# Patient Record
Sex: Female | Born: 1955 | Race: White | Hispanic: No | Marital: Married | Smoking: Never smoker
Health system: Southern US, Community
[De-identification: ages and names within clinical notes are randomized; demographics above are authoritative.]

## PROBLEM LIST (undated history)

## (undated) DIAGNOSIS — I10 Essential (primary) hypertension: Secondary | ICD-10-CM

## (undated) HISTORY — PX: TONSILLECTOMY: SUR1361

## (undated) HISTORY — DX: Essential (primary) hypertension: I10

---

## 1998-04-03 ENCOUNTER — Other Ambulatory Visit: Admission: RE | Admit: 1998-04-03 | Discharge: 1998-04-03 | Payer: Self-pay | Admitting: Obstetrics and Gynecology

## 1999-05-09 ENCOUNTER — Other Ambulatory Visit: Admission: RE | Admit: 1999-05-09 | Discharge: 1999-05-09 | Payer: Self-pay | Admitting: *Deleted

## 1999-05-21 ENCOUNTER — Ambulatory Visit (HOSPITAL_COMMUNITY): Admission: RE | Admit: 1999-05-21 | Discharge: 1999-05-21 | Payer: Self-pay | Admitting: Obstetrics and Gynecology

## 1999-05-21 ENCOUNTER — Encounter: Payer: Self-pay | Admitting: Obstetrics and Gynecology

## 2000-08-13 ENCOUNTER — Other Ambulatory Visit: Admission: RE | Admit: 2000-08-13 | Discharge: 2000-08-13 | Payer: Self-pay | Admitting: *Deleted

## 2000-08-13 ENCOUNTER — Other Ambulatory Visit: Admission: RE | Admit: 2000-08-13 | Discharge: 2000-08-13 | Payer: Self-pay | Admitting: Obstetrics and Gynecology

## 2000-08-14 ENCOUNTER — Other Ambulatory Visit: Admission: RE | Admit: 2000-08-14 | Discharge: 2000-08-14 | Payer: Self-pay | Admitting: *Deleted

## 2000-08-14 ENCOUNTER — Ambulatory Visit (HOSPITAL_COMMUNITY): Admission: RE | Admit: 2000-08-14 | Discharge: 2000-08-14 | Payer: Self-pay | Admitting: Obstetrics and Gynecology

## 2000-08-14 ENCOUNTER — Encounter (INDEPENDENT_AMBULATORY_CARE_PROVIDER_SITE_OTHER): Payer: Self-pay

## 2000-08-14 ENCOUNTER — Encounter: Payer: Self-pay | Admitting: Obstetrics and Gynecology

## 2000-11-16 ENCOUNTER — Other Ambulatory Visit: Admission: RE | Admit: 2000-11-16 | Discharge: 2000-11-16 | Payer: Self-pay | Admitting: *Deleted

## 2001-08-17 ENCOUNTER — Ambulatory Visit (HOSPITAL_COMMUNITY): Admission: RE | Admit: 2001-08-17 | Discharge: 2001-08-17 | Payer: Self-pay | Admitting: Obstetrics and Gynecology

## 2001-08-17 ENCOUNTER — Encounter: Payer: Self-pay | Admitting: Obstetrics and Gynecology

## 2001-09-09 ENCOUNTER — Other Ambulatory Visit: Admission: RE | Admit: 2001-09-09 | Discharge: 2001-09-09 | Payer: Self-pay | Admitting: Obstetrics and Gynecology

## 2002-11-08 ENCOUNTER — Ambulatory Visit (HOSPITAL_COMMUNITY): Admission: RE | Admit: 2002-11-08 | Discharge: 2002-11-08 | Payer: Self-pay | Admitting: Obstetrics and Gynecology

## 2002-11-08 ENCOUNTER — Encounter: Payer: Self-pay | Admitting: Obstetrics and Gynecology

## 2002-11-11 ENCOUNTER — Other Ambulatory Visit: Admission: RE | Admit: 2002-11-11 | Discharge: 2002-11-11 | Payer: Self-pay | Admitting: Obstetrics and Gynecology

## 2003-12-04 ENCOUNTER — Ambulatory Visit (HOSPITAL_COMMUNITY): Admission: RE | Admit: 2003-12-04 | Discharge: 2003-12-04 | Payer: Self-pay | Admitting: Obstetrics and Gynecology

## 2004-01-08 ENCOUNTER — Other Ambulatory Visit: Admission: RE | Admit: 2004-01-08 | Discharge: 2004-01-08 | Payer: Self-pay | Admitting: Obstetrics and Gynecology

## 2004-01-22 ENCOUNTER — Encounter: Admission: RE | Admit: 2004-01-22 | Discharge: 2004-01-22 | Payer: Self-pay | Admitting: Obstetrics and Gynecology

## 2004-12-03 ENCOUNTER — Ambulatory Visit (HOSPITAL_COMMUNITY): Admission: RE | Admit: 2004-12-03 | Discharge: 2004-12-03 | Payer: Self-pay | Admitting: Obstetrics and Gynecology

## 2005-04-08 ENCOUNTER — Other Ambulatory Visit: Admission: RE | Admit: 2005-04-08 | Discharge: 2005-04-08 | Payer: Self-pay | Admitting: Obstetrics & Gynecology

## 2005-12-24 ENCOUNTER — Encounter: Admission: RE | Admit: 2005-12-24 | Discharge: 2005-12-24 | Payer: Self-pay | Admitting: Obstetrics and Gynecology

## 2007-01-18 ENCOUNTER — Ambulatory Visit (HOSPITAL_COMMUNITY): Admission: RE | Admit: 2007-01-18 | Discharge: 2007-01-18 | Payer: Self-pay | Admitting: Internal Medicine

## 2007-05-28 ENCOUNTER — Ambulatory Visit (HOSPITAL_COMMUNITY): Admission: RE | Admit: 2007-05-28 | Discharge: 2007-05-28 | Payer: Self-pay | Admitting: Orthopedic Surgery

## 2007-09-09 ENCOUNTER — Ambulatory Visit: Payer: Self-pay | Admitting: Gastroenterology

## 2007-09-21 ENCOUNTER — Ambulatory Visit: Payer: Self-pay | Admitting: Gastroenterology

## 2007-09-21 HISTORY — PX: COLONOSCOPY: SHX174

## 2008-05-16 ENCOUNTER — Ambulatory Visit (HOSPITAL_COMMUNITY): Admission: RE | Admit: 2008-05-16 | Discharge: 2008-05-16 | Payer: Self-pay | Admitting: Internal Medicine

## 2008-08-09 ENCOUNTER — Encounter: Admission: RE | Admit: 2008-08-09 | Discharge: 2008-08-09 | Payer: Self-pay | Admitting: Obstetrics and Gynecology

## 2009-04-19 ENCOUNTER — Encounter: Admission: RE | Admit: 2009-04-19 | Discharge: 2009-04-19 | Payer: Self-pay | Admitting: Internal Medicine

## 2009-10-03 ENCOUNTER — Ambulatory Visit: Payer: Self-pay

## 2009-10-03 ENCOUNTER — Encounter: Payer: Self-pay | Admitting: Cardiovascular Disease

## 2010-03-18 ENCOUNTER — Encounter: Payer: Self-pay | Admitting: Obstetrics and Gynecology

## 2010-08-27 ENCOUNTER — Other Ambulatory Visit (HOSPITAL_COMMUNITY): Payer: Self-pay | Admitting: Internal Medicine

## 2010-08-27 DIAGNOSIS — Z1231 Encounter for screening mammogram for malignant neoplasm of breast: Secondary | ICD-10-CM

## 2010-09-03 ENCOUNTER — Ambulatory Visit (HOSPITAL_COMMUNITY)
Admission: RE | Admit: 2010-09-03 | Discharge: 2010-09-03 | Disposition: A | Discharge status: Home / Self Care | Payer: Commercial Indemnity | Source: Ambulatory Visit | Attending: Internal Medicine | Admitting: Internal Medicine

## 2010-09-03 DIAGNOSIS — Z1231 Encounter for screening mammogram for malignant neoplasm of breast: Secondary | ICD-10-CM

## 2014-03-16 LAB — HM PAP SMEAR: HM PAP: NEGATIVE

## 2015-02-27 LAB — BASIC METABOLIC PANEL
BUN: 16 (ref 4–21)
Creatinine: 0.8 (ref 0.5–1.1)
Glucose: 91
POTASSIUM: 4.4 (ref 3.4–5.3)
SODIUM: 136 — AB (ref 137–147)

## 2015-02-27 LAB — HEPATIC FUNCTION PANEL
ALT: 17 (ref 7–35)
AST: 20 (ref 13–35)
Alkaline Phosphatase: 77 (ref 25–125)
Bilirubin, Total: 0.6

## 2015-02-27 LAB — LIPID PANEL
CHOLESTEROL: 249 — AB (ref 0–200)
HDL: 84 — AB (ref 35–70)
LDL CALC: 149
Triglycerides: 79 (ref 40–160)

## 2015-03-09 LAB — CBC AND DIFFERENTIAL
HEMATOCRIT: 41 (ref 36–46)
Hemoglobin: 13.8 (ref 12.0–16.0)
PLATELETS: 256 (ref 150–399)
WBC: 6.9

## 2015-03-09 LAB — PROGESTERONE: Progesterone: 6.5

## 2015-03-09 LAB — T4, FREE: Free T4: 0.87

## 2015-03-09 LAB — HEMOGLOBIN A1C: HEMOGLOBIN A1C: 5.4

## 2015-03-09 LAB — VITAMIN D 25 HYDROXY (VIT D DEFICIENCY, FRACTURES): VIT D 25 HYDROXY: 70

## 2015-03-09 LAB — TSH: TSH: 0.33 — AB (ref <=5.90)

## 2015-03-09 LAB — MAGNESIUM: MAGNESIUM: 5.1

## 2015-03-09 LAB — T3, FREE: Free T3: 3.7

## 2015-03-09 LAB — ESTRADIOL: Estradiol: 35.4

## 2015-03-09 LAB — ESTROGENS, TOTAL: Estrogen, Total: 217.5

## 2015-03-09 LAB — VITAMIN B12

## 2015-03-28 ENCOUNTER — Encounter: Payer: Self-pay | Admitting: Gastroenterology

## 2015-05-31 DIAGNOSIS — D1800 Hemangioma unspecified site: Secondary | ICD-10-CM | POA: Diagnosis not present

## 2015-05-31 DIAGNOSIS — L821 Other seborrheic keratosis: Secondary | ICD-10-CM | POA: Diagnosis not present

## 2015-05-31 DIAGNOSIS — D225 Melanocytic nevi of trunk: Secondary | ICD-10-CM | POA: Diagnosis not present

## 2015-07-23 DIAGNOSIS — J029 Acute pharyngitis, unspecified: Secondary | ICD-10-CM | POA: Diagnosis not present

## 2015-12-27 ENCOUNTER — Telehealth: Payer: Self-pay | Admitting: Family Medicine

## 2015-12-27 NOTE — Telephone Encounter (Signed)
Call patient to set up a new patient appointment with Dr. Tamala Julian.  We had got a request on the website. No answer, I LVM to call back.

## 2015-12-27 NOTE — Progress Notes (Signed)
[facility] [address], [place] [address] Phone: [phone] Subjective:    I'm seeing this patient by the request  of:  Dr. Sharol Roussel  CC: low back pain   [id number]  Lindsey Dyer is a 60 y.o. female coming in with complaint of low back pain Patient states that this is been going on for 2 years intermittently but seems to have worsened over the course last month. Patient has tried some different exercises with no significant improvement. States that it does seem to be radiating down the left leg, most the pain seems to be in the buttocks area. Denies any weakness. States that is worse with going from a seated to standing position. Denies any fevers chills any abnormal weight loss. Rates the severity of pain 6 out of 10. It is affecting some daily activities. Patient is concerned cousin any type of sitting including in the car for long amount of time causes worsening symptoms.    No past medical history on file. No past surgical history on file. Social History   Social History  . Marital status: Married    Spouse name: N/A  . Number of children: N/A  . Years of education: N/A   Social History Main Topics  . Smoking status: Never Smoker  . Smokeless tobacco: Never Used  . Alcohol use None  . Drug use: Unknown  . Sexual activity: Not Asked   Other Topics Concern  . None   Social History Narrative  . None   Not on File No family history on file. no rheumatologic disease.    Past medical history, social, surgical and family history all reviewed in electronic medical record.  No pertanent information unless stated regarding to the chief complaint.   Review of Systems:Review of systems updated and as accurate as of 12/28/15  No headache, visual changes, nausea, vomiting, diarrhea, constipation, dizziness, abdominal pain, skin rash, fevers, chills, night sweats, weight loss, swollen lymph nodes, body aches, joint swelling, muscle aches,  chest pain, shortness of breath, mood changes.   Objective  Blood pressure 132/80, pulse 78, height 5\' 5"  (1.651 m), weight 156 lb (70.8 kg), SpO2 97 %. Systems examined below as of 12/28/15   General: No apparent distress alert and oriented x3 mood and affect normal, dressed appropriately.  HEENT: Pupils equal, extraocular movements intact  Respiratory: Patient's speak in full sentences and does not appear short of breath  Cardiovascular: No lower extremity edema, non tender, no erythema  Skin: Warm dry intact with no signs of infection or rash on extremities or on axial skeleton.  Abdomen: Soft nontender  Neuro: Cranial nerves II through XII are intact, neurovascularly intact in all extremities with 2+ DTRs and 2+ pulses.  Lymph: No lymphadenopathy of posterior or anterior cervical chain or axillae bilaterally.  Gait normal with good balance and coordination.  MSK:  Non tender with full range of motion and good stability and symmetric strength and tone of shoulders, elbows, wrist, hip, knee and ankles bilaterally.  Back Exam:  Inspection: Unremarkable  Motion: Flexion 30 deg with crepitus and worsening radiation down the left side, Extension 25 deg, Side Bending to 45 deg bilaterally,  Rotation to 45 deg bilaterally  SLR laying: Negative hamstring's type bilaterally XSLR laying: Negative  Palpable tenderness: Tender to palpation in the paraspinal musculature of the lumbar spine. Mostly over the left side. Severe tenderness over the piriformis on the left sign. FABER: Positive on left Sensory change: Gross sensation  intact to all lumbar and sacral dermatomes.  Reflexes: 2+ at both patellar tendons, 2+ at achilles tendons, Babinski's downgoing.  Strength at foot  Plantar-flexion: 5/5 Dorsi-flexion: 5/5 Eversion: 5/5 Inversion: 5/5  Leg strength  Quad: 5/5 Hamstring: 5/5 Hip flexor: 5/5 Hip abductors: 3/5 but symmetric Gait unremarkable.  Procedure note [id number]; 15 minutes spent for  Therapeutic exercises as stated in above notes.  This included exercises focusing on stretching, strengthening, with significant focus on eccentric aspects. Low back exercises that included:  Pelvic tilt/bracing instruction to focus on control of the pelvic girdle and lower abdominal muscles  Glute strengthening exercises, focusing on proper firing of the glutes without engaging the low back muscles Proper stretching techniques for maximum relief for the hamstrings, hip flexors, low back and some rotation where tolerated Proper technique shown and discussed handout in great detail with ATC.  All questions were discussed and answered.     Impression and Recommendations:     This case required medical decision making of moderate complexity.      Note: This dictation was prepared with Dragon dictation along with smaller phrase technology. Any transcriptional errors that result from this process are unintentional.

## 2015-12-28 ENCOUNTER — Ambulatory Visit (INDEPENDENT_AMBULATORY_CARE_PROVIDER_SITE_OTHER)
Admission: RE | Admit: 2015-12-28 | Discharge: 2015-12-28 | Disposition: A | Discharge status: Home / Self Care | Payer: BLUE CROSS/BLUE SHIELD | Source: Ambulatory Visit | Attending: Family Medicine | Admitting: Family Medicine

## 2015-12-28 ENCOUNTER — Ambulatory Visit (INDEPENDENT_AMBULATORY_CARE_PROVIDER_SITE_OTHER): Payer: BLUE CROSS/BLUE SHIELD | Admitting: Family Medicine

## 2015-12-28 ENCOUNTER — Encounter: Payer: Self-pay | Admitting: Family Medicine

## 2015-12-28 VITALS — BP 132/80 | HR 78 | Ht 65.0 in | Wt 156.0 lb

## 2015-12-28 DIAGNOSIS — M5440 Lumbago with sciatica, unspecified side: Secondary | ICD-10-CM

## 2015-12-28 DIAGNOSIS — G8929 Other chronic pain: Secondary | ICD-10-CM

## 2015-12-28 DIAGNOSIS — M5416 Radiculopathy, lumbar region: Secondary | ICD-10-CM

## 2015-12-28 DIAGNOSIS — S76312A Strain of muscle, fascia and tendon of the posterior muscle group at thigh level, left thigh, initial encounter: Secondary | ICD-10-CM | POA: Insufficient documentation

## 2015-12-28 DIAGNOSIS — M5136 Other intervertebral disc degeneration, lumbar region: Secondary | ICD-10-CM | POA: Diagnosis not present

## 2015-12-28 NOTE — Patient Instructions (Signed)
Good to see you  Xray downstairs.  Ice 20 minutes 2 times daily. Usually after activity and before bed. Exercises 3 times a week.  Physical therapy will be calling  Meloxicam daily for 10 days then as needed.  Gabapentin 200mg   At night See me again in 3-4 weeks.

## 2015-12-28 NOTE — Assessment & Plan Note (Signed)
Does have some symptoms of radiation Mil hip abductor weakness.  Piriformis syndrome as well Crepitus noted. Will get xrays  Started on gabapentin and meloxicam for neurologic, as well as any inflammatory processes occurring. We'll start formal physical. Return to office in 2-3 weeks.

## 2015-12-29 ENCOUNTER — Telehealth: Payer: Self-pay | Admitting: Family Medicine

## 2015-12-29 NOTE — Telephone Encounter (Signed)
Pt called during Saturday clinic. Medications- Meloxicam and Gabapentin during visit yesterday were not at her pharmacy. Please advise

## 2015-12-30 MED ORDER — MELOXICAM 15 MG PO TABS: 15.0000 mg | ORAL_TABLET | Freq: Every day | ORAL | 0 refills | Status: DC

## 2015-12-30 MED ORDER — GABAPENTIN 100 MG PO CAPS: 200.0000 mg | ORAL_CAPSULE | Freq: Every day | ORAL | 3 refills | Status: DC

## 2015-12-30 NOTE — Telephone Encounter (Signed)
Sent in sorry

## 2016-01-08 DIAGNOSIS — M5440 Lumbago with sciatica, unspecified side: Secondary | ICD-10-CM | POA: Diagnosis not present

## 2016-01-21 DIAGNOSIS — M5440 Lumbago with sciatica, unspecified side: Secondary | ICD-10-CM | POA: Diagnosis not present

## 2016-01-24 NOTE — Progress Notes (Signed)
[facility] [address], [place] [address] Phone: [phone] Subjective:      CC: low back pain f/u   [id number]  Lindsey Dyer is a 60 y.o. female coming in with complaint of low back pain Patient was found to have a strain of the piriformis and also some signs and symptoms though is consistent with more of a lumbar radiculopathy. Patient was started on gabapentin, oral anti-inflammatories, icing regimen, home exercises and exercises focusing on hip and core strengthening. Patient states She is improving significantly. Has started with formal physical therapy and feels that has been helpful. Patient states that she is not having as severe pain. When on a car ride for 8 hours and did well as long she continued the stretches at night.    No past medical history on file. No past surgical history on file. Social History   Social History  . Marital status: Married    Spouse name: N/A  . Number of children: N/A  . Years of education: N/A   Social History Main Topics  . Smoking status: Never Smoker  . Smokeless tobacco: Never Used  . Alcohol use None  . Drug use: Unknown  . Sexual activity: Not Asked   Other Topics Concern  . None   Social History Narrative  . None   Not on File No family history on file. no rheumatologic disease.    Past medical history, social, surgical and family history all reviewed in electronic medical record.  No pertanent information unless stated regarding to the chief complaint.   Review of Systems:Review of systems updated and as accurate as of 01/25/16  No headache, visual changes, nausea, vomiting, diarrhea, constipation, dizziness, abdominal pain, skin rash, fevers, chills, night sweats, weight loss, swollen lymph nodes, body aches, joint swelling, muscle aches, chest pain, shortness of breath, mood changes.   Objective  Blood pressure 114/70, pulse 74, height 5\' 5"  (1.651 m), weight 166 lb (75.3 kg),  SpO2 97 %.  Systems examined below as of 01/25/16 General: NAD A&O x3 mood, affect normal  HEENT: Pupils equal, extraocular movements intact no nystagmus Respiratory: not short of breath at rest or with speaking Cardiovascular: No lower extremity edema, non tender Skin: Warm dry intact with no signs of infection or rash on extremities or on axial skeleton. Abdomen: Soft nontender, no masses Neuro: Cranial nerves  intact, neurovascularly intact in all extremities with 2+ DTRs and 2+ pulses. Lymph: No lymphadenopathy appreciated today  Gait normal with good balance and coordination.  MSK: Non tender with full range of motion and good stability and symmetric strength and tone of shoulders, elbows, wrist,  knee hips and ankles bilaterally.  and tone of shoulders, elbows, wrist, hip, knee and ankles bilaterally.  Back Exam:  Inspection: Unremarkable  Motion: Flexion 30 deg w Extension 25 deg, Side Bending to 45 deg bilaterally,  Rotation to 45 deg bilaterally  SLR laying: Negative hamstring's type bilaterally XSLR laying: Negative  Palpable tenderness: Mild discomfort of the piriformis muscle. FABER: Positive on left Sensory change: Gross sensation intact to all lumbar and sacral dermatomes.  Reflexes: 2+ at both patellar tendons, 2+ at achilles tendons, Babinski's downgoing.  Strength at foot  Plantar-flexion: 5/5 Dorsi-flexion: 5/5 Eversion: 5/5 Inversion: 5/5  Leg strength  Quad: 5/5 Hamstring: 5/5 Hip flexor: 5/5 Hip abductors: 3+/5 but symmetric Gait unremarkable. Mild improvement from previous exam    Impression and Recommendations:     This case required medical decision  making of moderate complexity.      Note: This dictation was prepared with Dragon dictation along with smaller phrase technology. Any transcriptional errors that result from this process are unintentional.

## 2016-01-25 ENCOUNTER — Encounter: Payer: Self-pay | Admitting: Family Medicine

## 2016-01-25 ENCOUNTER — Ambulatory Visit (INDEPENDENT_AMBULATORY_CARE_PROVIDER_SITE_OTHER): Payer: BLUE CROSS/BLUE SHIELD | Admitting: Family Medicine

## 2016-01-25 DIAGNOSIS — S76312A Strain of muscle, fascia and tendon of the posterior muscle group at thigh level, left thigh, initial encounter: Secondary | ICD-10-CM

## 2016-01-25 DIAGNOSIS — M5416 Radiculopathy, lumbar region: Secondary | ICD-10-CM | POA: Diagnosis not present

## 2016-01-25 NOTE — Patient Instructions (Signed)
Great to see you  You are doing great  Stay active PT one more time to learn a home routine.  See me again in 6-8 weeks.  Happy holidays!

## 2016-01-25 NOTE — Assessment & Plan Note (Signed)
Patient does have moderate arthritis. Could be contributing to some of the discomfort. We did discuss with patient that this could be more of a long-standing aspect. Gabapentin encourage. Patient continue with core strengthening and hip abductor strengthening. Follow-up again in 6 weeks.

## 2016-01-25 NOTE — Assessment & Plan Note (Signed)
Patient is having significant less radicular symptoms. Seems to be having significantly less pain as well. I do believe the patient is improving. Encourage her to use the gabapentin on a more regular basis. Patient will finish up with formal physical therapy. Continue conservative therapy. Follow-up again in 6 weeks for further evaluation and treatment.

## 2016-01-29 DIAGNOSIS — M5440 Lumbago with sciatica, unspecified side: Secondary | ICD-10-CM | POA: Diagnosis not present

## 2016-03-21 ENCOUNTER — Ambulatory Visit: Payer: BLUE CROSS/BLUE SHIELD | Admitting: Family Medicine

## 2016-07-14 DIAGNOSIS — D2261 Melanocytic nevi of right upper limb, including shoulder: Secondary | ICD-10-CM | POA: Diagnosis not present

## 2016-07-14 DIAGNOSIS — D1800 Hemangioma unspecified site: Secondary | ICD-10-CM | POA: Diagnosis not present

## 2016-07-14 DIAGNOSIS — L821 Other seborrheic keratosis: Secondary | ICD-10-CM | POA: Diagnosis not present

## 2016-07-14 DIAGNOSIS — D225 Melanocytic nevi of trunk: Secondary | ICD-10-CM | POA: Diagnosis not present

## 2016-08-11 DIAGNOSIS — H5213 Myopia, bilateral: Secondary | ICD-10-CM | POA: Diagnosis not present

## 2017-04-13 NOTE — Progress Notes (Signed)
Subjective:    Patient ID: Lindsey Dyer, female    DOB: December 27, 1955, 63 y.o.   MRN: [id number]  HPI:  Ms. Lindsey Dyer is here to establish as a new pt.  She is a pleasant 62 year old female.  PMH: HTN, Hypothyroidism, and Obesity.  She has been off Lisinopril/HCTZ for couple of weeks and denies CP/dyspnea/palpitations.  She estimates to drink >80 oz water/day and follows a low CHO/saturated fat diet. She walks 15 mins, 3 times a day- weather permitting. She has hx of L piriformis muscle strain with lumbar radiculopathy- none at present. She denies acute complaints today and over feels that her health is " is quite good". She denies tobacco/ETOH use   Patient Care Team    Relationship Specialty Notifications Start End  Esaw Grandchild, NP PCP - General Family Medicine  04/16/17     Patient Active Problem List   Diagnosis Date Noted  . Lumbar radiculopathy 12/28/2015  . Strain of piriformis muscle, left, initial encounter 12/28/2015     History reviewed. No pertinent past medical history.   History reviewed. No pertinent surgical history.   Family History  Problem Relation Age of Onset  . Hypertension Mother   . Stroke Father   . Healthy Brother   . Healthy Daughter   . Healthy Son   . Stroke Paternal Aunt   . Healthy Brother   . Cancer Brother        lung  . Cancer Brother        prostate     Social History   Substance and Sexual Activity  Drug Use No     Social History   Substance and Sexual Activity  Alcohol Use No  . Frequency: Never     Social History   Tobacco Use  Smoking Status Never Smoker  Smokeless Tobacco Never Used     Outpatient Encounter Medications as of 04/16/2017  Medication Sig Note  . ARMOUR THYROID 60 MG tablet  12/28/2015: Received from: External Pharmacy  . gabapentin (NEURONTIN) 100 MG capsule Take 2 capsules (200 mg total) by mouth at bedtime. (Patient not taking: Reported on 04/16/2017)   . lisinopril-hydrochlorothiazide  (PRINZIDE,ZESTORETIC) 10-12.5 MG tablet Take 1 tablet by mouth daily.   . [DISCONTINUED] lisinopril-hydrochlorothiazide (PRINZIDE,ZESTORETIC) 10-12.5 MG tablet  12/28/2015: Received from: External Pharmacy  . [DISCONTINUED] meloxicam (MOBIC) 15 MG tablet Take 1 tablet (15 mg total) by mouth daily.    No facility-administered encounter medications on file as of 04/16/2017.     Allergies: Patient has no known allergies.  Body mass index is 30.4 kg/m.  Blood pressure (!) 158/91, pulse 85, height 5\' 5"  (1.651 m), weight 182 lb 11.2 oz (82.9 kg), SpO2 97 %.     Review of Systems  Constitutional: Positive for fatigue. Negative for activity change, appetite change, chills, diaphoresis, fever and unexpected weight change.  HENT: Negative for congestion.   Eyes: Negative for visual disturbance.  Respiratory: Negative for cough, chest tightness, shortness of breath, wheezing and stridor.   Cardiovascular: Negative for chest pain, palpitations and leg swelling.  Gastrointestinal: Negative for abdominal distention, abdominal pain, blood in stool, constipation, diarrhea, nausea, rectal pain and vomiting.  Endocrine: Negative for cold intolerance, heat intolerance, polydipsia, polyphagia and polyuria.  Genitourinary: Negative for difficulty urinating and flank pain.  Musculoskeletal: Negative for arthralgias, back pain, gait problem, joint swelling, myalgias, neck pain and neck stiffness.  Skin: Negative for color change, pallor, rash and wound.  Neurological: Negative for dizziness and  headaches.  Hematological: Does not bruise/bleed easily.  Psychiatric/Behavioral: Negative for dysphoric mood, hallucinations, self-injury, sleep disturbance and suicidal ideas. The patient is not nervous/anxious and is not hyperactive.        Objective:   Physical Exam  Constitutional: She is oriented to person, place, and time. She appears well-developed and well-nourished. No distress.  HENT:  Head:  Normocephalic and atraumatic.  Right Ear: External ear normal.  Left Ear: External ear normal.  Eyes: Conjunctivae are normal. Pupils are equal, round, and reactive to light.  Cardiovascular: Normal rate, regular rhythm, normal heart sounds and intact distal pulses.  No murmur heard. Pulmonary/Chest: Effort normal and breath sounds normal. No respiratory distress. She has no wheezes. She has no rales. She exhibits no tenderness.  Neurological: She is alert and oriented to person, place, and time.  Skin: Skin is warm and dry. No rash noted. She is not diaphoretic. No erythema. No pallor.  Psychiatric: She has a normal mood and affect. Her behavior is normal. Judgment and thought content normal.  Nursing note and vitals reviewed.         Assessment & Plan:   1. Screening for breast cancer   2. Need for Tdap vaccination   3. Healthcare maintenance   4. Hypertension, unspecified type   5. Strain of piriformis muscle, left, initial encounter   6. Lumbar radiculopathy     Healthcare maintenance Continue your excellent water intake!  Follow Heart Healthy diet Continue regular exercise.  Recommend at least 30 minutes daily, 5 days per week of walking, jogging, biking, swimming, YouTube/Pinterest workout videos. Refill on Lisinopril/HCTZ sent in. We will call you when lab results are available. Please schedule complete physical 1-2 months.  Strain of piriformis muscle, left, initial encounter Denies current pain  Lumbar radiculopathy Denies current pain  Hypertension BP above goal 158/91, HR 85 She has been off Lisinopril/HCTZ 10/12.5mg  for weeks Rx refill sent in Continue heart healthy diet and regular walking    FOLLOW-UP:  Return in about 2 months (around 06/14/2017) for CPE.

## 2017-04-16 ENCOUNTER — Encounter: Payer: Self-pay | Admitting: Adult Health

## 2017-04-16 ENCOUNTER — Ambulatory Visit (INDEPENDENT_AMBULATORY_CARE_PROVIDER_SITE_OTHER): Payer: BLUE CROSS/BLUE SHIELD | Admitting: Adult Health

## 2017-04-16 ENCOUNTER — Other Ambulatory Visit: Payer: Self-pay | Admitting: Adult Health

## 2017-04-16 VITALS — BP 158/91 | HR 85 | Ht 65.0 in | Wt 182.7 lb

## 2017-04-16 DIAGNOSIS — M5416 Radiculopathy, lumbar region: Secondary | ICD-10-CM

## 2017-04-16 DIAGNOSIS — Z1231 Encounter for screening mammogram for malignant neoplasm of breast: Secondary | ICD-10-CM

## 2017-04-16 DIAGNOSIS — I10 Essential (primary) hypertension: Secondary | ICD-10-CM | POA: Diagnosis not present

## 2017-04-16 DIAGNOSIS — Z1239 Encounter for other screening for malignant neoplasm of breast: Secondary | ICD-10-CM

## 2017-04-16 DIAGNOSIS — Z23 Encounter for immunization: Secondary | ICD-10-CM

## 2017-04-16 DIAGNOSIS — Z0000 Encounter for general adult medical examination without abnormal findings: Secondary | ICD-10-CM | POA: Diagnosis not present

## 2017-04-16 DIAGNOSIS — S76312A Strain of muscle, fascia and tendon of the posterior muscle group at thigh level, left thigh, initial encounter: Secondary | ICD-10-CM | POA: Diagnosis not present

## 2017-04-16 DIAGNOSIS — R7989 Other specified abnormal findings of blood chemistry: Secondary | ICD-10-CM | POA: Diagnosis not present

## 2017-04-16 MED ORDER — LISINOPRIL-HYDROCHLOROTHIAZIDE 10-12.5 MG PO TABS: 1.0000 | ORAL_TABLET | Freq: Every day | ORAL | 2 refills | Status: DC

## 2017-04-16 NOTE — Assessment & Plan Note (Signed)
Denies current pain

## 2017-04-16 NOTE — Assessment & Plan Note (Signed)
Continue your excellent water intake!  Follow Heart Healthy diet Continue regular exercise.  Recommend at least 30 minutes daily, 5 days per week of walking, jogging, biking, swimming, YouTube/Pinterest workout videos. Refill on Lisinopril/HCTZ sent in. We will call you when lab results are available. Please schedule complete physical 1-2 months.

## 2017-04-16 NOTE — Patient Instructions (Signed)
Heart-Healthy Eating Plan Many factors influence your heart health, including eating and exercise habits. Heart (coronary) risk increases with abnormal blood fat (lipid) levels. Heart-healthy meal planning includes limiting unhealthy fats, increasing healthy fats, and making other small dietary changes. This includes maintaining a healthy body weight to help keep lipid levels within a normal range. What is my plan? Your health care provider recommends that you:  Get no more than __25__% of the total calories in your daily diet from fat.  Limit your intake of saturated fat to less than __5___% of your total calories each day.  Limit the amount of cholesterol in your diet to less than __300__ mg per day.  What types of fat should I choose?  Choose healthy fats more often. Choose monounsaturated and polyunsaturated fats, such as olive oil and canola oil, flaxseeds, walnuts, almonds, and seeds.  Eat more omega-3 fats. Good choices include salmon, mackerel, sardines, tuna, flaxseed oil, and ground flaxseeds. Aim to eat fish at least two times each week.  Limit saturated fats. Saturated fats are primarily found in animal products, such as meats, butter, and cream. Plant sources of saturated fats include palm oil, palm kernel oil, and coconut oil.  Avoid foods with partially hydrogenated oils in them. These contain trans fats. Examples of foods that contain trans fats are stick margarine, some tub margarines, cookies, crackers, and other baked goods. What general guidelines do I need to follow?  Check food labels carefully to identify foods with trans fats or high amounts of saturated fat.  Fill one half of your plate with vegetables and green salads. Eat 4-5 servings of vegetables per day. A serving of vegetables equals 1 cup of raw leafy vegetables,  cup of raw or cooked cut-up vegetables, or  cup of vegetable juice.  Fill one fourth of your plate with whole grains. Look for the word "whole"  as the first word in the ingredient list.  Fill one fourth of your plate with lean protein foods.  Eat 4-5 servings of fruit per day. A serving of fruit equals one medium whole fruit,  cup of dried fruit,  cup of fresh, frozen, or canned fruit, or  cup of 100% fruit juice.  Eat more foods that contain soluble fiber. Examples of foods that contain this type of fiber are apples, broccoli, carrots, beans, peas, and barley. Aim to get 20-30 g of fiber per day.  Eat more home-cooked food and less restaurant, buffet, and fast food.  Limit or avoid alcohol.  Limit foods that are high in starch and sugar.  Avoid fried foods.  Cook foods by using methods other than frying. Baking, boiling, grilling, and broiling are all great options. Other fat-reducing suggestions include: ? Removing the skin from poultry. ? Removing all visible fats from meats. ? Skimming the fat off of stews, soups, and gravies before serving them. ? Steaming vegetables in water or broth.  Lose weight if you are overweight. Losing just 5-10% of your initial body weight can help your overall health and prevent diseases such as diabetes and heart disease.  Increase your consumption of nuts, legumes, and seeds to 4-5 servings per week. One serving of dried beans or legumes equals  cup after being cooked, one serving of nuts equals 1 ounces, and one serving of seeds equals  ounce or 1 tablespoon.  You may need to monitor your salt (sodium) intake, especially if you have high blood pressure. Talk with your health care provider or dietitian to get  more information about reducing sodium. What foods can I eat? Grains  Breads, including [place], white, pita, wheat, raisin, rye, oatmeal, and [place]. Tortillas that are neither fried nor made with lard or trans fat. Low-fat rolls, including hotdog and hamburger buns and English muffins. Biscuits. Muffins. Waffles. Pancakes. Light popcorn. Whole-grain cereals. Flatbread. Melba  toast. Pretzels. Breadsticks. Rusks. Low-fat snacks and crackers, including oyster, saltine, matzo, graham, animal, and rye. Rice and pasta, including brown rice and those that are made with whole wheat. Vegetables All vegetables. Fruits All fruits, but limit coconut. Meats and Other Protein Sources Lean, well-trimmed beef, veal, pork, and lamb. Chicken and [place] without skin. All fish and shellfish. Wild duck, rabbit, pheasant, and venison. Egg whites or low-cholesterol egg substitutes. Dried beans, peas, lentils, and tofu.Seeds and most nuts. Dairy Low-fat or nonfat cheeses, including ricotta, string, and mozzarella. Skim or 1% milk that is liquid, powdered, or evaporated. Buttermilk that is made with low-fat milk. Nonfat or low-fat yogurt. Beverages Mineral water. Diet carbonated beverages. Sweets and Desserts Sherbets and fruit ices. Honey, jam, marmalade, jelly, and syrups. Meringues and gelatins. Pure sugar candy, such as hard candy, jelly beans, gumdrops, mints, marshmallows, and small amounts of dark chocolate. [employer] Eat all sweets and desserts in moderation. Fats and Oils Nonhydrogenated (trans-free) margarines. Vegetable oils, including soybean, sesame, sunflower, olive, peanut, safflower, corn, canola, and cottonseed. Salad dressings or mayonnaise that are made with a vegetable oil. Limit added fats and oils that you use for cooking, baking, salads, and as spreads. Other Cocoa powder. Coffee and tea. All seasonings and condiments. The items listed above may not be a complete list of recommended foods or beverages. Contact your dietitian for more options. What foods are not recommended? Grains Breads that are made with saturated or trans fats, oils, or whole milk. Croissants. Butter rolls. Cheese breads. Sweet rolls. Donuts. Buttered popcorn. Chow mein noodles. High-fat crackers, such as cheese or butter crackers. Meats and Other Protein Sources Fatty meats, such as  hotdogs, short ribs, sausage, spareribs, bacon, ribeye roast or steak, and mutton. High-fat deli meats, such as salami and bologna. Caviar. Domestic duck and goose. Organ meats, such as kidney, liver, sweetbreads, brains, gizzard, chitterlings, and heart. Dairy Cream, sour cream, cream cheese, and creamed cottage cheese. Whole milk cheeses, including blue (bleu), Monte[place] Jack, [place], [place], American, [place], Swiss, [place], [place], and [place]. Whole or 2% milk that is liquid, evaporated, or condensed. Whole buttermilk. Cream sauce or high-fat cheese sauce. Yogurt that is made from whole milk. Beverages Regular sodas and drinks with added sugar. Sweets and Desserts Frosting. Pudding. Cookies. Cakes other than angel food cake. Candy that has milk chocolate or white chocolate, hydrogenated fat, butter, coconut, or unknown ingredients. Buttered syrups. Full-fat ice cream or ice cream drinks. Fats and Oils Gravy that has suet, meat fat, or shortening. Cocoa butter, hydrogenated oils, palm oil, coconut oil, palm kernel oil. These can often be found in baked products, candy, fried foods, nondairy creamers, and whipped toppings. Solid fats and shortenings, including bacon fat, salt pork, lard, and butter. Nondairy cream substitutes, such as coffee creamers and sour cream substitutes. Salad dressings that are made of unknown oils, cheese, or sour cream. The items listed above may not be a complete list of foods and beverages to avoid. Contact your dietitian for more information. This information is not intended to replace advice given to you by your health care provider. Make sure you discuss any questions you have with your health care  provider. Document Released: 11/20/2007 Document Revised: 08/31/2015 Document Reviewed: 08/04/2013 Elsevier Interactive Patient Education  2018 [employer].   Hypertension Hypertension, commonly called high blood pressure, is when the force of blood pumping through the  arteries is too strong. The arteries are the blood vessels that carry blood from the heart throughout the body. Hypertension forces the heart to work harder to pump blood and may cause arteries to become narrow or stiff. Having untreated or uncontrolled hypertension can cause heart attacks, strokes, kidney disease, and other problems. A blood pressure reading consists of a higher number over a lower number. Ideally, your blood pressure should be below 120/80. The first ("top") number is called the systolic pressure. It is a measure of the pressure in your arteries as your heart beats. The second ("bottom") number is called the diastolic pressure. It is a measure of the pressure in your arteries as the heart relaxes. What are the causes? The cause of this condition is not known. What increases the risk? Some risk factors for high blood pressure are under your control. Others are not. Factors you can change  Smoking.  Having type 2 diabetes mellitus, high cholesterol, or both.  Not getting enough exercise or physical activity.  Being overweight.  Having too much fat, sugar, calories, or salt (sodium) in your diet.  Drinking too much alcohol. Factors that are difficult or impossible to change  Having chronic kidney disease.  Having a family history of high blood pressure.  Age. Risk increases with age.  Race. You may be at higher risk if you are African-American.  Gender. Men are at higher risk than women before age 54. After age 11, women are at higher risk than men.  Having obstructive sleep apnea.  Stress. What are the signs or symptoms? Extremely high blood pressure (hypertensive crisis) may cause:  Headache.  Anxiety.  Shortness of breath.  Nosebleed.  Nausea and vomiting.  Severe chest pain.  Jerky movements you cannot control (seizures).  How is this diagnosed? This condition is diagnosed by measuring your blood pressure while you are seated, with your arm  resting on a surface. The cuff of the blood pressure monitor will be placed directly against the skin of your upper arm at the level of your heart. It should be measured at least twice using the same arm. Certain conditions can cause a difference in blood pressure between your right and left arms. Certain factors can cause blood pressure readings to be lower or higher than normal (elevated) for a short period of time:  When your blood pressure is higher when you are in a health care provider's office than when you are at home, this is called white coat hypertension. Most people with this condition do not need medicines.  When your blood pressure is higher at home than when you are in a health care provider's office, this is called masked hypertension. Most people with this condition may need medicines to control blood pressure.  If you have a high blood pressure reading during one visit or you have normal blood pressure with other risk factors:  You may be asked to return on a different day to have your blood pressure checked again.  You may be asked to monitor your blood pressure at home for 1 week or longer.  If you are diagnosed with hypertension, you may have other blood or imaging tests to help your health care provider understand your overall risk for other conditions. How is this treated? This  condition is treated by making healthy lifestyle changes, such as eating healthy foods, exercising more, and reducing your alcohol intake. Your health care provider may prescribe medicine if lifestyle changes are not enough to get your blood pressure under control, and if:  Your systolic blood pressure is above 130.  Your diastolic blood pressure is above 80.  Your personal target blood pressure may vary depending on your medical conditions, your age, and other factors. Follow these instructions at home: Eating and drinking  Eat a diet that is high in fiber and potassium, and low in sodium,  added sugar, and fat. An example eating plan is called the DASH (Dietary Approaches to Stop Hypertension) diet. To eat this way: ? Eat plenty of fresh fruits and vegetables. Try to fill half of your plate at each meal with fruits and vegetables. ? Eat whole grains, such as whole wheat pasta, brown rice, or whole grain bread. Fill about one quarter of your plate with whole grains. ? Eat or drink low-fat dairy products, such as skim milk or low-fat yogurt. ? Avoid fatty cuts of meat, processed or cured meats, and poultry with skin. Fill about one quarter of your plate with lean proteins, such as fish, chicken without skin, beans, eggs, and tofu. ? Avoid premade and processed foods. These tend to be higher in sodium, added sugar, and fat.  Reduce your daily sodium intake. Most people with hypertension should eat less than 1,500 mg of sodium a day.  Limit alcohol intake to no more than 1 drink a day for nonpregnant women and 2 drinks a day for men. One drink equals 12 oz of beer, 5 oz of wine, or 1 oz of hard liquor. Lifestyle  Work with your health care provider to maintain a healthy body weight or to lose weight. Ask what an ideal weight is for you.  Get at least 30 minutes of exercise that causes your heart to beat faster (aerobic exercise) most days of the week. Activities may include walking, swimming, or biking.  Include exercise to strengthen your muscles (resistance exercise), such as pilates or lifting weights, as part of your weekly exercise routine. Try to do these types of exercises for 30 minutes at least 3 days a week.  Do not use any products that contain nicotine or tobacco, such as cigarettes and e-cigarettes. If you need help quitting, ask your health care provider.  Monitor your blood pressure at home as told by your health care provider.  Keep all follow-up visits as told by your health care provider. This is important. Medicines  Take over-the-counter and prescription  medicines only as told by your health care provider. Follow directions carefully. Blood pressure medicines must be taken as prescribed.  Do not skip doses of blood pressure medicine. Doing this puts you at risk for problems and can make the medicine less effective.  Ask your health care provider about side effects or reactions to medicines that you should watch for. Contact a health care provider if:  You think you are having a reaction to a medicine you are taking.  You have headaches that keep coming back (recurring).  You feel dizzy.  You have swelling in your ankles.  You have trouble with your vision. Get help right away if:  You develop a severe headache or confusion.  You have unusual weakness or numbness.  You feel faint.  You have severe pain in your chest or abdomen.  You vomit repeatedly.  You have trouble breathing.  Summary  Hypertension is when the force of blood pumping through your arteries is too strong. If this condition is not controlled, it may put you at risk for serious complications.  Your personal target blood pressure may vary depending on your medical conditions, your age, and other factors. For most people, a normal blood pressure is less than 120/80.  Hypertension is treated with lifestyle changes, medicines, or a combination of both. Lifestyle changes include weight loss, eating a healthy, low-sodium diet, exercising more, and limiting alcohol. This information is not intended to replace advice given to you by your health care provider. Make sure you discuss any questions you have with your health care provider. Document Released: 02/10/2005 Document Revised: 01/09/2016 Document Reviewed: 01/09/2016 Elsevier Interactive Patient Education  2018 [place] your excellent water intake!  Follow Heart Healthy diet Continue regular exercise.  Recommend at least 30 minutes daily, 5 days per week of walking, jogging, biking, swimming,  YouTube/Pinterest workout videos. Refill on Lisinopril/HCTZ sent in. We will call you when lab results are available. Please schedule complete physical 1-2 months. WELCOME TO THE PRACTICE!

## 2017-04-16 NOTE — Assessment & Plan Note (Signed)
BP above goal 158/91, HR 85 She has been off Lisinopril/HCTZ 10/12.5mg  for weeks Rx refill sent in Continue heart healthy diet and regular walking

## 2017-04-17 LAB — CBC WITH DIFFERENTIAL/PLATELET
BASOS ABS: 0 10*3/uL (ref 0.0–0.2)
Basos: 1 %
EOS (ABSOLUTE): 0.2 10*3/uL (ref 0.0–0.4)
Eos: 4 %
Hematocrit: 46.6 % (ref 34.0–46.6)
Hemoglobin: 15.5 g/dL (ref 11.1–15.9)
Immature Grans (Abs): 0 10*3/uL (ref 0.0–0.1)
Immature Granulocytes: 0 %
Lymphocytes Absolute: 1.7 10*3/uL (ref 0.7–3.1)
Lymphs: 34 %
MCH: 30.8 pg (ref 26.6–33.0)
MCHC: 33.3 g/dL (ref 31.5–35.7)
MCV: 93 fL (ref 79–97)
MONOS ABS: 0.6 10*3/uL (ref 0.1–0.9)
Monocytes: 12 %
NEUTROS ABS: 2.6 10*3/uL (ref 1.4–7.0)
Neutrophils: 49 %
Platelets: 294 10*3/uL (ref 150–379)
RBC: 5.03 x10E6/uL (ref 3.77–5.28)
RDW: 12.4 % (ref 12.3–15.4)
WBC: 5.1 10*3/uL (ref 3.4–10.8)

## 2017-04-17 LAB — COMPREHENSIVE METABOLIC PANEL
ALK PHOS: 99 IU/L (ref 39–117)
ALT: 21 IU/L (ref 0–32)
AST: 22 IU/L (ref 0–40)
Albumin/Globulin Ratio: 1.7 (ref 1.2–2.2)
Albumin: 4.8 g/dL (ref 3.6–4.8)
BUN / CREAT RATIO: 14 (ref 12–28)
BUN: 12 mg/dL (ref 8–27)
CHLORIDE: 103 mmol/L (ref 96–106)
CO2: 24 mmol/L (ref 20–29)
CREATININE: 0.86 mg/dL (ref 0.57–1.00)
Calcium: 9.9 mg/dL (ref 8.7–10.3)
GFR calc Af Amer: 84 mL/min/{1.73_m2} (ref >59)
GFR calc non Af Amer: 73 mL/min/{1.73_m2} (ref >59)
GLUCOSE: 98 mg/dL (ref 65–99)
Globulin, Total: 2.8 g/dL (ref 1.5–4.5)
Potassium: 5.1 mmol/L (ref 3.5–5.2)
SODIUM: 141 mmol/L (ref 134–144)
Total Protein: 7.6 g/dL (ref 6.0–8.5)

## 2017-04-17 LAB — LIPID PANEL
Chol/HDL Ratio: 3.2 ratio (ref 0.0–4.4)
Cholesterol, Total: 222 mg/dL — ABNORMAL HIGH (ref 100–199)
HDL: 69 mg/dL (ref >39)
LDL Calculated: 140 mg/dL — ABNORMAL HIGH (ref 0–99)
Triglycerides: 65 mg/dL (ref 0–149)
VLDL Cholesterol Cal: 13 mg/dL (ref 5–40)

## 2017-04-17 LAB — HEMOGLOBIN A1C
ESTIMATED AVERAGE GLUCOSE: 103 mg/dL
HEMOGLOBIN A1C: 5.2 % (ref 4.8–5.6)

## 2017-04-17 LAB — TSH: TSH: 5.07 u[IU]/mL — AB (ref 0.450–4.500)

## 2017-04-23 LAB — T3: T3 TOTAL: 100 ng/dL (ref 71–180)

## 2017-04-23 LAB — T4, FREE: FREE T4: 0.96 ng/dL (ref 0.82–1.77)

## 2017-04-23 LAB — SPECIMEN STATUS REPORT

## 2017-05-19 NOTE — Progress Notes (Signed)
Subjective:    Patient ID: Lindsey Dyer, female    DOB: February 24, 1956, 62 y.o.   MRN: [id number]  HPI: 04/16/17 OV:  Lindsey Dyer is here to establish as a new pt.  She is a pleasant 62 year old female.  PMH: HTN, Hypothyroidism, and Obesity.  She has been off Lisinopril/HCTZ for couple of weeks and denies CP/dyspnea/palpitations.  She estimates to drink >80 oz water/day and follows a low CHO/saturated fat diet. She walks 15 mins, 3 times a day- weather permitting. She has hx of L piriformis muscle strain with lumbar radiculopathy- none at present. She denies acute complaints today and over feels that her health is " is quite good". She denies tobacco/ETOH use  05/21/17 OV: Lindsey Dyer presents for CPE. She remains off Armour Thyroid and denies fatigue/hair loss. She has been taking Lisinopril/HCTZ as directed, denies CP/dyspnea/palpitations. She denies acute complaints today.  Healthcare Maintenance: PAP- completed today Mammogram- order placed Colonoscopy- last completed 2009, due this year  Patient Care Team    Relationship Specialty Notifications Start End  Esaw Grandchild, NP PCP - General Family Medicine  04/16/17     Patient Active Problem List   Diagnosis Date Noted  . Screening for cervical cancer 05/21/2017  . Cervical lesion 05/21/2017  . Hypertension 04/16/2017  . Healthcare maintenance 04/16/2017  . Lumbar radiculopathy 12/28/2015  . Strain of piriformis muscle, left, initial encounter 12/28/2015     History reviewed. No pertinent past medical history.   History reviewed. No pertinent surgical history.   Family History  Problem Relation Age of Onset  . Hypertension Mother   . Stroke Father   . Healthy Brother   . Healthy Daughter   . Healthy Son   . Stroke Paternal Aunt   . Healthy Brother   . Cancer Brother        lung  . Cancer Brother        prostate     Social History   Substance and Sexual Activity  Drug Use No     Social History    Substance and Sexual Activity  Alcohol Use No  . Frequency: Never     Social History   Tobacco Use  Smoking Status Never Smoker  Smokeless Tobacco Never Used     Outpatient Encounter Medications as of 05/21/2017  Medication Sig Note  . lisinopril-hydrochlorothiazide (PRINZIDE,ZESTORETIC) 10-12.5 MG tablet Take 1 tablet by mouth daily.   . [DISCONTINUED] ARMOUR THYROID 60 MG tablet  12/28/2015: Received from: External Pharmacy  . [DISCONTINUED] gabapentin (NEURONTIN) 100 MG capsule Take 2 capsules (200 mg total) by mouth at bedtime.    No facility-administered encounter medications on file as of 05/21/2017.     Allergies: Patient has no known allergies.  Body mass index is 30.04 kg/m.  Blood pressure 119/74, pulse 87, height 5\' 5"  (1.651 m), weight 180 lb 8 oz (81.9 kg), SpO2 99 %.  Review of Systems  Constitutional: Negative for activity change, appetite change, chills, diaphoresis, fatigue, fever and unexpected weight change.  HENT: Negative for congestion.   Eyes: Negative for visual disturbance.  Respiratory: Negative for cough, chest tightness, shortness of breath, wheezing and stridor.   Cardiovascular: Negative for chest pain, palpitations and leg swelling.  Gastrointestinal: Negative for abdominal distention, abdominal pain, blood in stool, constipation, diarrhea, nausea, rectal pain and vomiting.  Endocrine: Negative for cold intolerance, heat intolerance, polydipsia, polyphagia and polyuria.  Genitourinary: Negative for difficulty urinating and flank pain.  Musculoskeletal: Negative for arthralgias, back  pain, gait problem, joint swelling, myalgias, neck pain and neck stiffness.  Skin: Negative for color change, pallor, rash and wound.  Neurological: Negative for dizziness and headaches.  Hematological: Does not bruise/bleed easily.  Psychiatric/Behavioral: Negative for dysphoric mood, hallucinations, self-injury, sleep disturbance and suicidal ideas. The patient  is not nervous/anxious and is not hyperactive.        Objective:   Physical Exam  Constitutional: She is oriented to person, place, and time. She appears well-developed and well-nourished. No distress.  HENT:  Head: Normocephalic and atraumatic.  Right Ear: Hearing, external ear and ear canal normal. Tympanic membrane is not erythematous and not bulging. No decreased hearing is noted.  Left Ear: Hearing, tympanic membrane, external ear and ear canal normal. Tympanic membrane is not erythematous and not bulging. No decreased hearing is noted.  Nose: Nose normal. No mucosal edema or rhinorrhea. Right sinus exhibits no maxillary sinus tenderness and no frontal sinus tenderness. Left sinus exhibits no maxillary sinus tenderness and no frontal sinus tenderness.  Mouth/Throat: Uvula is midline, oropharynx is clear and moist and mucous membranes are normal.  Eyes: Pupils are equal, round, and reactive to light. Conjunctivae are normal.  Neck: Normal range of motion. Neck supple.  Cardiovascular: Normal rate, regular rhythm, normal heart sounds and intact distal pulses.  No murmur heard. Pulmonary/Chest: Effort normal and breath sounds normal. No respiratory distress. She has no wheezes. She has no rales. She exhibits no tenderness.  Abdominal: Soft. Bowel sounds are normal. She exhibits no distension and no mass. There is no tenderness. There is no rebound and no guarding.  Genitourinary: There is no rash on the right labia. There is no rash on the left labia. Uterus is not tender. Cervix exhibits no discharge. No vaginal discharge found.    Genitourinary Comments: One red lesion noted to L of cervix Lesion bleed with swept with vaginal swab Chaperone present during examination.  Lymphadenopathy:    She has no cervical adenopathy.  Neurological: She is alert and oriented to person, place, and time.  Skin: Skin is warm and dry. No rash noted. She is not diaphoretic. No erythema. No pallor.   Psychiatric: She has a normal mood and affect. Her behavior is normal. Judgment and thought content normal.  Nursing note and vitals reviewed.         Assessment & Plan:   1. Screening for cervical cancer   2. Hypertension, unspecified type   3. Cervical lesion   4. Healthcare maintenance     Healthcare maintenance Please continue Lisinopril/HCTZ as directed- your blood pressure looks great! Referral to OB/GYN placed. Continue to drink water and follow heart healthy diet. Emmit Alexanders with your mother's move! 6 months follow-up, sooner if needed.  Hypertension BP at goal 119/74, HR 87 Continue Lisinopril/HCTZ 10/12.5mg  QD   Cervical lesion 1 bright red lesion noted to L of cervix. Lesion bleed with swept with vaginal swab. Discussed findings with pt, she reported "I may have told about this before" She cannot recall last contact with GYN   Screening for cervical cancer PAP completed today.  FOLLOW-UP:  Return in about 6 months (around 11/21/2017) for Regular Follow Up, HTN.

## 2017-05-21 ENCOUNTER — Ambulatory Visit (INDEPENDENT_AMBULATORY_CARE_PROVIDER_SITE_OTHER): Payer: BLUE CROSS/BLUE SHIELD | Admitting: Adult Health

## 2017-05-21 ENCOUNTER — Telehealth: Payer: Self-pay | Admitting: Obstetrics & Gynecology

## 2017-05-21 ENCOUNTER — Other Ambulatory Visit (HOSPITAL_COMMUNITY)
Admission: RE | Admit: 2017-05-21 | Discharge: 2017-05-21 | Disposition: A | Discharge status: Home / Self Care | Payer: BLUE CROSS/BLUE SHIELD | Source: Ambulatory Visit | Attending: Adult Health | Admitting: Adult Health

## 2017-05-21 ENCOUNTER — Encounter: Payer: Self-pay | Admitting: Adult Health

## 2017-05-21 VITALS — BP 119/74 | HR 87 | Ht 65.0 in | Wt 180.5 lb

## 2017-05-21 DIAGNOSIS — I10 Essential (primary) hypertension: Secondary | ICD-10-CM

## 2017-05-21 DIAGNOSIS — N889 Noninflammatory disorder of cervix uteri, unspecified: Secondary | ICD-10-CM

## 2017-05-21 DIAGNOSIS — Z0000 Encounter for general adult medical examination without abnormal findings: Secondary | ICD-10-CM

## 2017-05-21 DIAGNOSIS — Z124 Encounter for screening for malignant neoplasm of cervix: Secondary | ICD-10-CM | POA: Insufficient documentation

## 2017-05-21 NOTE — Patient Instructions (Signed)
Preventive Care for Adults, Female  A healthy lifestyle and preventive care can promote health and wellness. Preventive health guidelines for women include the following key practices.   A routine yearly physical is a good way to check with your health care provider about your health and preventive screening. It is a chance to share any concerns and updates on your health and to receive a thorough exam.   Visit your dentist for a routine exam and preventive care every 6 months. Brush your teeth twice a day and floss once a day. Good oral hygiene prevents tooth decay and gum disease.   The frequency of eye exams is based on your age, health, family medical history, use of contact lenses, and other factors. Follow your health care provider's recommendations for frequency of eye exams.   Eat a healthy diet. Foods like vegetables, fruits, whole grains, low-fat dairy products, and lean protein foods contain the nutrients you need without too many calories. Decrease your intake of foods high in solid fats, added sugars, and salt. Eat the right amount of calories for you.Get information about a proper diet from your health care provider, if necessary.   Regular physical exercise is one of the most important things you can do for your health. Most adults should get at least 150 minutes of moderate-intensity exercise (any activity that increases your heart rate and causes you to sweat) each week. In addition, most adults need muscle-strengthening exercises on 2 or more days a week.   Maintain a healthy weight. The body mass index (BMI) is a screening tool to identify possible weight problems. It provides an estimate of body fat based on height and weight. Your health care provider can find your BMI, and can help you achieve or maintain a healthy weight.For adults 20 years and older:   - A BMI below 18.5 is considered underweight.   - A BMI of 18.5 to 24.9 is normal.   - A BMI of 25 to 29.9 is  considered overweight.   - A BMI of 30 and above is considered obese.   Maintain normal blood lipids and cholesterol levels by exercising and minimizing your intake of trans and saturated fats.  Eat a balanced diet with plenty of fruit and vegetables. Blood tests for lipids and cholesterol should begin at age 20 and be repeated every 5 years minimum.  If your lipid or cholesterol levels are high, you are over 40, or you are at high risk for heart disease, you may need your cholesterol levels checked more frequently.Ongoing high lipid and cholesterol levels should be treated with medicines if diet and exercise are not working.   If you smoke, find out from your health care provider how to quit. If you do not use tobacco, do not start.   Lung cancer screening is recommended for adults aged 55-80 years who are at high risk for developing lung cancer because of a history of smoking. A yearly low-dose CT scan of the lungs is recommended for people who have at least a 30-pack-year history of smoking and are a current smoker or have quit within the past 15 years. A pack year of smoking is smoking an average of 1 pack of cigarettes a day for 1 year (for example: 1 pack a day for 30 years or 2 packs a day for 15 years). Yearly screening should continue until the smoker has stopped smoking for at least 15 years. Yearly screening should be stopped for people who develop a   health problem that would prevent them from having lung cancer treatment.   If you are pregnant, do not drink alcohol. If you are breastfeeding, be very cautious about drinking alcohol. If you are not pregnant and choose to drink alcohol, do not have more than 1 drink per day. One drink is considered to be 12 ounces (355 mL) of beer, 5 ounces (148 mL) of wine, or 1.5 ounces (44 mL) of liquor.   Avoid use of street drugs. Do not share needles with anyone. Ask for help if you need support or instructions about stopping the use of  drugs.   High blood pressure causes heart disease and increases the risk of stroke. Your blood pressure should be checked at least yearly.  Ongoing high blood pressure should be treated with medicines if weight loss and exercise do not work.   If you are 69-55 years old, ask your health care provider if you should take aspirin to prevent strokes.   Diabetes screening involves taking a blood sample to check your fasting blood sugar level. This should be done once every 3 years, after age 38, if you are within normal weight and without risk factors for diabetes. Testing should be considered at a younger age or be carried out more frequently if you are overweight and have at least 1 risk factor for diabetes.   Breast cancer screening is essential preventive care for women. You should practice "breast self-awareness."  This means understanding the normal appearance and feel of your breasts and may include breast self-examination.  Any changes detected, no matter how small, should be reported to a health care provider.  Women in their 80s and 30s should have a clinical breast exam (CBE) by a health care provider as part of a regular health exam every 1 to 3 years.  After age 66, women should have a CBE every year.  Starting at age 1, women should consider having a mammogram (breast X-ray test) every year.  Women who have a family history of breast cancer should talk to their health care provider about genetic screening.  Women at a high risk of breast cancer should talk to their health care providers about having an MRI and a mammogram every year.   -Breast cancer gene (BRCA)-related cancer risk assessment is recommended for women who have family members with BRCA-related cancers. BRCA-related cancers include breast, ovarian, tubal, and peritoneal cancers. Having family members with these cancers may be associated with an increased risk for harmful changes (mutations) in the breast cancer genes BRCA1 and  BRCA2. Results of the assessment will determine the need for genetic counseling and BRCA1 and BRCA2 testing.   The Pap test is a screening test for cervical cancer. A Pap test can show cell changes on the cervix that might become cervical cancer if left untreated. A Pap test is a procedure in which cells are obtained and examined from the lower end of the uterus (cervix).   - Women should have a Pap test starting at age 57.   - Between ages 90 and 70, Pap tests should be repeated every 2 years.   - Beginning at age 63, you should have a Pap test every 3 years as long as the past 3 Pap tests have been normal.   - Some women have medical problems that increase the chance of getting cervical cancer. Talk to your health care provider about these problems. It is especially important to talk to your health care provider if a  new problem develops soon after your last Pap test. In these cases, your health care provider may recommend more frequent screening and Pap tests.   - The above recommendations are the same for women who have or have not gotten the vaccine for human papillomavirus (HPV).   - If you had a hysterectomy for a problem that was not cancer or a condition that could lead to cancer, then you no longer need Pap tests. Even if you no longer need a Pap test, a regular exam is a good idea to make sure no other problems are starting.   - If you are between ages 36 and 66 years, and you have had normal Pap tests going back 10 years, you no longer need Pap tests. Even if you no longer need a Pap test, a regular exam is a good idea to make sure no other problems are starting.   - If you have had past treatment for cervical cancer or a condition that could lead to cancer, you need Pap tests and screening for cancer for at least 20 years after your treatment.   - If Pap tests have been discontinued, risk factors (such as a new sexual partner) need to be reassessed to determine if screening should  be resumed.   - The HPV test is an additional test that may be used for cervical cancer screening. The HPV test looks for the virus that can cause the cell changes on the cervix. The cells collected during the Pap test can be tested for HPV. The HPV test could be used to screen women aged 70 years and older, and should be used in women of any age who have unclear Pap test results. After the age of 67, women should have HPV testing at the same frequency as a Pap test.   Colorectal cancer can be detected and often prevented. Most routine colorectal cancer screening begins at the age of 57 years and continues through age 26 years. However, your health care provider may recommend screening at an earlier age if you have risk factors for colon cancer. On a yearly basis, your health care provider may provide home test kits to check for hidden blood in the stool.  Use of a small camera at the end of a tube, to directly examine the colon (sigmoidoscopy or colonoscopy), can detect the earliest forms of colorectal cancer. Talk to your health care provider about this at age 23, when routine screening begins. Direct exam of the colon should be repeated every 5 -10 years through age 49 years, unless early forms of pre-cancerous polyps or small growths are found.   People who are at an increased risk for hepatitis B should be screened for this virus. You are considered at high risk for hepatitis B if:  -You were born in a country where hepatitis B occurs often. Talk with your health care provider about which countries are considered high risk.  - Your parents were born in a high-risk country and you have not received a shot to protect against hepatitis B (hepatitis B vaccine).  - You have HIV or AIDS.  - You use needles to inject street drugs.  - You live with, or have sex with, someone who has Hepatitis B.  - You get hemodialysis treatment.  - You take certain medicines for conditions like cancer, organ  transplantation, and autoimmune conditions.   Hepatitis C blood testing is recommended for all people born from [birth year] through 1965 and any individual  with known risks for hepatitis C.   Practice safe sex. Use condoms and avoid high-risk sexual practices to reduce the spread of sexually transmitted infections (STIs). STIs include gonorrhea, chlamydia, syphilis, trichomonas, herpes, HPV, and human immunodeficiency virus (HIV). Herpes, HIV, and HPV are viral illnesses that have no cure. They can result in disability, cancer, and death. Sexually active women aged 25 years and younger should be checked for chlamydia. Older women with new or multiple partners should also be tested for chlamydia. Testing for other STIs is recommended if you are sexually active and at increased risk.   Osteoporosis is a disease in which the bones lose minerals and strength with aging. This can result in serious bone fractures or breaks. The risk of osteoporosis can be identified using a bone density scan. Women ages 65 years and over and women at risk for fractures or osteoporosis should discuss screening with their health care providers. Ask your health care provider whether you should take a calcium supplement or vitamin D to There are also several preventive steps women can take to avoid osteoporosis and resulting fractures or to keep osteoporosis from worsening. -->Recommendations include:  Eat a balanced diet high in fruits, vegetables, calcium, and vitamins.  Get enough calcium. The recommended total intake of is 1,200 mg daily; for best absorption, if taking supplements, divide doses into 250-500 mg doses throughout the day. Of the two types of calcium, calcium carbonate is best absorbed when taken with food but calcium citrate can be taken on an empty stomach.  Get enough vitamin D. NAMS and the National Osteoporosis Foundation recommend at least 1,000 IU per day for women age 50 and over who are at risk of vitamin D  deficiency. Vitamin D deficiency can be caused by inadequate sun exposure (for example, those who live in northern latitudes).  Avoid alcohol and smoking. Heavy alcohol intake (more than 7 drinks per week) increases the risk of falls and hip fracture and women smokers tend to lose bone more rapidly and have lower bone mass than nonsmokers. Stopping smoking is one of the most important changes women can make to improve their health and decrease risk for disease.  Be physically active every day. Weight-bearing exercise (for example, fast walking, hiking, jogging, and weight training) may strengthen bones or slow the rate of bone loss that comes with aging. Balancing and muscle-strengthening exercises can reduce the risk of falling and fracture.  Consider therapeutic medications. Currently, several types of effective drugs are available. Healthcare providers can recommend the type most appropriate for each woman.  Eliminate environmental factors that may contribute to accidents. Falls cause nearly 90% of all osteoporotic fractures, so reducing this risk is an important bone-health strategy. Measures include ample lighting, removing obstructions to walking, using nonskid rugs on floors, and placing mats and/or grab bars in showers.  Be aware of medication side effects. Some common medicines make bones weaker. These include a type of steroid drug called glucocorticoids used for arthritis and asthma, some antiseizure drugs, certain sleeping pills, treatments for endometriosis, and some cancer drugs. An overactive thyroid gland or using too much thyroid hormone for an underactive thyroid can also be a problem. If you are taking these medicines, talk to your doctor about what you can do to help protect your bones.reduce the rate of osteoporosis.    Menopause can be associated with physical symptoms and risks. Hormone replacement therapy is available to decrease symptoms and risks. You should talk to your  health care provider   about whether hormone replacement therapy is right for you.   Use sunscreen. Apply sunscreen liberally and repeatedly throughout the day. You should seek shade when your shadow is shorter than you. Protect yourself by wearing long sleeves, pants, a wide-brimmed hat, and sunglasses year round, whenever you are outdoors.   Once a month, do a whole body skin exam, using a mirror to look at the skin on your back. Tell your health care provider of new moles, moles that have irregular borders, moles that are larger than a pencil eraser, or moles that have changed in shape or color.   -Stay current with required vaccines (immunizations).   Influenza vaccine. All adults should be immunized every year.  Tetanus, diphtheria, and acellular pertussis (Td, Tdap) vaccine. Pregnant women should receive 1 dose of Tdap vaccine during each pregnancy. The dose should be obtained regardless of the length of time since the last dose. Immunization is preferred during the 27th 36th week of gestation. An adult who has not previously received Tdap or who does not know her vaccine status should receive 1 dose of Tdap. This initial dose should be followed by tetanus and diphtheria toxoids (Td) booster doses every 10 years. Adults with an unknown or incomplete history of completing a 3-dose immunization series with Td-containing vaccines should begin or complete a primary immunization series including a Tdap dose. Adults should receive a Td booster every 10 years.  Varicella vaccine. An adult without evidence of immunity to varicella should receive 2 doses or a second dose if she has previously received 1 dose. Pregnant females who do not have evidence of immunity should receive the first dose after pregnancy. This first dose should be obtained before leaving the health care facility. The second dose should be obtained 4 8 weeks after the first dose.  Human papillomavirus (HPV) vaccine. Females aged 13 26  years who have not received the vaccine previously should obtain the 3-dose series. The vaccine is not recommended for use in pregnant females. However, pregnancy testing is not needed before receiving a dose. If a female is found to be pregnant after receiving a dose, no treatment is needed. In that case, the remaining doses should be delayed until after the pregnancy. Immunization is recommended for any person with an immunocompromised condition through the age of 26 years if she did not get any or all doses earlier. During the 3-dose series, the second dose should be obtained 4 8 weeks after the first dose. The third dose should be obtained 24 weeks after the first dose and 16 weeks after the second dose.  Zoster vaccine. One dose is recommended for adults aged 60 years or older unless certain conditions are present.  Measles, mumps, and rubella (MMR) vaccine. Adults born before 1957 generally are considered immune to measles and mumps. Adults born in 1957 or later should have 1 or more doses of MMR vaccine unless there is a contraindication to the vaccine or there is laboratory evidence of immunity to each of the three diseases. A routine second dose of MMR vaccine should be obtained at least 28 days after the first dose for students attending postsecondary schools, health care workers, or international travelers. People who received inactivated measles vaccine or an unknown type of measles vaccine during 1963 1967 should receive 2 doses of MMR vaccine. People who received inactivated mumps vaccine or an unknown type of mumps vaccine before 1979 and are at high risk for mumps infection should consider immunization with 2 doses of   MMR vaccine. For females of childbearing age, rubella immunity should be determined. If there is no evidence of immunity, females who are not pregnant should be vaccinated. If there is no evidence of immunity, females who are pregnant should delay immunization until after pregnancy.  Unvaccinated health care workers born before [birth year] who lack laboratory evidence of measles, mumps, or rubella immunity or laboratory confirmation of disease should consider measles and mumps immunization with 2 doses of MMR vaccine or rubella immunization with 1 dose of MMR vaccine.  Pneumococcal 13-valent conjugate (PCV13) vaccine. When indicated, a person who is uncertain of her immunization history and has no record of immunization should receive the PCV13 vaccine. An adult aged 54 years or older who has certain medical conditions and has not been previously immunized should receive 1 dose of PCV13 vaccine. This PCV13 should be followed with a dose of pneumococcal polysaccharide (PPSV23) vaccine. The PPSV23 vaccine dose should be obtained at least 8 weeks after the dose of PCV13 vaccine. An adult aged 58 years or older who has certain medical conditions and previously received 1 or more doses of PPSV23 vaccine should receive 1 dose of PCV13. The PCV13 vaccine dose should be obtained 1 or more years after the last PPSV23 vaccine dose.  Pneumococcal polysaccharide (PPSV23) vaccine. When PCV13 is also indicated, PCV13 should be obtained first. All adults aged 58 years and older should be immunized. An adult younger than age 65 years who has certain medical conditions should be immunized. Any person who resides in a nursing home or long-term care facility should be immunized. An adult smoker should be immunized. People with an immunocompromised condition and certain other conditions should receive both PCV13 and PPSV23 vaccines. People with human immunodeficiency virus (HIV) infection should be immunized as soon as possible after diagnosis. Immunization during chemotherapy or radiation therapy should be avoided. Routine use of PPSV23 vaccine is not recommended for American Indians, [place] Natives, or people younger than 65 years unless there are medical conditions that require PPSV23 vaccine. When indicated,  people who have unknown immunization and have no record of immunization should receive PPSV23 vaccine. One-time revaccination 5 years after the first dose of PPSV23 is recommended for people aged 70 64 years who have chronic kidney failure, nephrotic syndrome, asplenia, or immunocompromised conditions. People who received 1 2 doses of PPSV23 before age 32 years should receive another dose of PPSV23 vaccine at age 96 years or later if at least 5 years have passed since the previous dose. Doses of PPSV23 are not needed for people immunized with PPSV23 at or after age 55 years.  Meningococcal vaccine. Adults with asplenia or persistent complement component deficiencies should receive 2 doses of quadrivalent meningococcal conjugate (MenACWY-D) vaccine. The doses should be obtained at least 2 months apart. Microbiologists working with certain meningococcal bacteria, [employer] recruits, people at risk during an outbreak, and people who travel to or live in countries with a high rate of meningitis should be immunized. A first-year college student up through age 58 years who is living in a residence hall should receive a dose if she did not receive a dose on or after her 16th birthday. Adults who have certain high-risk conditions should receive one or more doses of vaccine.  Hepatitis A vaccine. Adults who wish to be protected from this disease, have certain high-risk conditions, work with hepatitis A-infected animals, work in hepatitis A research labs, or travel to or work in countries with a high rate of hepatitis A should be  immunized. Adults who were previously unvaccinated and who anticipate close contact with an international adoptee during the first 60 days after arrival in the [place] from a country with a high rate of hepatitis A should be immunized.  Hepatitis B vaccine.  Adults who wish to be protected from this disease, have certain high-risk conditions, may be exposed to blood or other infectious  body fluids, are household contacts or sex partners of hepatitis B positive people, are clients or workers in certain care facilities, or travel to or work in countries with a high rate of hepatitis B should be immunized.  Haemophilus influenzae type b (Hib) vaccine. A previously unvaccinated person with asplenia or sickle cell disease or having a scheduled splenectomy should receive 1 dose of Hib vaccine. Regardless of previous immunization, a recipient of a hematopoietic stem cell transplant should receive a 3-dose series 6 12 months after her successful transplant. Hib vaccine is not recommended for adults with HIV infection.  Preventive Services / Frequency Ages 6 to 39years  Blood pressure check.** / Every 1 to 2 years.  Lipid and cholesterol check.** / Every 5 years beginning at age 39.  Clinical breast exam.** / Every 3 years for women in their 61s and 62s.  BRCA-related cancer risk assessment.** / For women who have family members with a BRCA-related cancer (breast, ovarian, tubal, or peritoneal cancers).  Pap test.** / Every 2 years from ages 47 through 85. Every 3 years starting at age 34 through age 12 or 74 with a history of 3 consecutive normal Pap tests.  HPV screening.** / Every 3 years from ages 46 through ages 43 to 54 with a history of 3 consecutive normal Pap tests.  Hepatitis C blood test.** / For any individual with known risks for hepatitis C.  Skin self-exam. / Monthly.  Influenza vaccine. / Every year.  Tetanus, diphtheria, and acellular pertussis (Tdap, Td) vaccine.** / Consult your health care provider. Pregnant women should receive 1 dose of Tdap vaccine during each pregnancy. 1 dose of Td every 10 years.  Varicella vaccine.** / Consult your health care provider. Pregnant females who do not have evidence of immunity should receive the first dose after pregnancy.  HPV vaccine. / 3 doses over 6 months, if 64 and younger. The vaccine is not recommended for use in  pregnant females. However, pregnancy testing is not needed before receiving a dose.  Measles, mumps, rubella (MMR) vaccine.** / You need at least 1 dose of MMR if you were born in 1957 or later. You may also need a 2nd dose. For females of childbearing age, rubella immunity should be determined. If there is no evidence of immunity, females who are not pregnant should be vaccinated. If there is no evidence of immunity, females who are pregnant should delay immunization until after pregnancy.  Pneumococcal 13-valent conjugate (PCV13) vaccine.** / Consult your health care provider.  Pneumococcal polysaccharide (PPSV23) vaccine.** / 1 to 2 doses if you smoke cigarettes or if you have certain conditions.  Meningococcal vaccine.** / 1 dose if you are age 71 to 37 years and a Market researcher living in a residence hall, or have one of several medical conditions, you need to get vaccinated against meningococcal disease. You may also need additional booster doses.  Hepatitis A vaccine.** / Consult your health care provider.  Hepatitis B vaccine.** / Consult your health care provider.  Haemophilus influenzae type b (Hib) vaccine.** / Consult your health care provider.  Ages 55 to 64years  Blood pressure check.** / Every 1 to 2 years.  Lipid and cholesterol check.** / Every 5 years beginning at age 20 years.  Lung cancer screening. / Every year if you are aged 55 80 years and have a 30-pack-year history of smoking and currently smoke or have quit within the past 15 years. Yearly screening is stopped once you have quit smoking for at least 15 years or develop a health problem that would prevent you from having lung cancer treatment.  Clinical breast exam.** / Every year after age 40 years.  BRCA-related cancer risk assessment.** / For women who have family members with a BRCA-related cancer (breast, ovarian, tubal, or peritoneal cancers).  Mammogram.** / Every year beginning at age 40  years and continuing for as long as you are in good health. Consult with your health care provider.  Pap test.** / Every 3 years starting at age 30 years through age 65 or 70 years with a history of 3 consecutive normal Pap tests.  HPV screening.** / Every 3 years from ages 30 years through ages 65 to 70 years with a history of 3 consecutive normal Pap tests.  Fecal occult blood test (FOBT) of stool. / Every year beginning at age 50 years and continuing until age 75 years. You may not need to do this test if you get a colonoscopy every 10 years.  Flexible sigmoidoscopy or colonoscopy.** / Every 5 years for a flexible sigmoidoscopy or every 10 years for a colonoscopy beginning at age 50 years and continuing until age 75 years.  Hepatitis C blood test.** / For all people born from 1945 through 1965 and any individual with known risks for hepatitis C.  Skin self-exam. / Monthly.  Influenza vaccine. / Every year.  Tetanus, diphtheria, and acellular pertussis (Tdap/Td) vaccine.** / Consult your health care provider. Pregnant women should receive 1 dose of Tdap vaccine during each pregnancy. 1 dose of Td every 10 years.  Varicella vaccine.** / Consult your health care provider. Pregnant females who do not have evidence of immunity should receive the first dose after pregnancy.  Zoster vaccine.** / 1 dose for adults aged 60 years or older.  Measles, mumps, rubella (MMR) vaccine.** / You need at least 1 dose of MMR if you were born in 1957 or later. You may also need a 2nd dose. For females of childbearing age, rubella immunity should be determined. If there is no evidence of immunity, females who are not pregnant should be vaccinated. If there is no evidence of immunity, females who are pregnant should delay immunization until after pregnancy.  Pneumococcal 13-valent conjugate (PCV13) vaccine.** / Consult your health care provider.  Pneumococcal polysaccharide (PPSV23) vaccine.** / 1 to 2 doses if  you smoke cigarettes or if you have certain conditions.  Meningococcal vaccine.** / Consult your health care provider.  Hepatitis A vaccine.** / Consult your health care provider.  Hepatitis B vaccine.** / Consult your health care provider.  Haemophilus influenzae type b (Hib) vaccine.** / Consult your health care provider.  Ages 65 years and over  Blood pressure check.** / Every 1 to 2 years.  Lipid and cholesterol check.** / Every 5 years beginning at age 20 years.  Lung cancer screening. / Every year if you are aged 55 80 years and have a 30-pack-year history of smoking and currently smoke or have quit within the past 15 years. Yearly screening is stopped once you have quit smoking for at least 15 years or develop a health problem that   would prevent you from having lung cancer treatment.  Clinical breast exam.** / Every year after age 103 years.  BRCA-related cancer risk assessment.** / For women who have family members with a BRCA-related cancer (breast, ovarian, tubal, or peritoneal cancers).  Mammogram.** / Every year beginning at age 36 years and continuing for as long as you are in good health. Consult with your health care provider.  Pap test.** / Every 3 years starting at age 5 years through age 85 or 10 years with 3 consecutive normal Pap tests. Testing can be stopped between 65 and 70 years with 3 consecutive normal Pap tests and no abnormal Pap or HPV tests in the past 10 years.  HPV screening.** / Every 3 years from ages 93 years through ages 70 or 45 years with a history of 3 consecutive normal Pap tests. Testing can be stopped between 65 and 70 years with 3 consecutive normal Pap tests and no abnormal Pap or HPV tests in the past 10 years.  Fecal occult blood test (FOBT) of stool. / Every year beginning at age 8 years and continuing until age 45 years. You may not need to do this test if you get a colonoscopy every 10 years.  Flexible sigmoidoscopy or colonoscopy.** /  Every 5 years for a flexible sigmoidoscopy or every 10 years for a colonoscopy beginning at age 69 years and continuing until age 68 years.  Hepatitis C blood test.** / For all people born from [birth year] through 1965 and any individual with known risks for hepatitis C.  Osteoporosis screening.** / A one-time screening for women ages 7 years and over and women at risk for fractures or osteoporosis.  Skin self-exam. / Monthly.  Influenza vaccine. / Every year.  Tetanus, diphtheria, and acellular pertussis (Tdap/Td) vaccine.** / 1 dose of Td every 10 years.  Varicella vaccine.** / Consult your health care provider.  Zoster vaccine.** / 1 dose for adults aged 5 years or older.  Pneumococcal 13-valent conjugate (PCV13) vaccine.** / Consult your health care provider.  Pneumococcal polysaccharide (PPSV23) vaccine.** / 1 dose for all adults aged 74 years and older.  Meningococcal vaccine.** / Consult your health care provider.  Hepatitis A vaccine.** / Consult your health care provider.  Hepatitis B vaccine.** / Consult your health care provider.  Haemophilus influenzae type b (Hib) vaccine.** / Consult your health care provider. ** Family history and personal history of risk and conditions may change your health care provider's recommendations. Document Released: 04/08/2001 Document Revised: 12/01/2012  [facility] Patient Information 2014 [place], [place].   EXERCISE AND DIET:  We recommended that you start or continue a regular exercise program for good health. Regular exercise means any activity that makes your heart beat faster and makes you sweat.  We recommend exercising at least 30 minutes per day at least 3 days a week, preferably 5.  We also recommend a diet low in fat and sugar / carbohydrates.  Inactivity, poor dietary choices and obesity can cause diabetes, heart attack, stroke, and kidney damage, among others.     ALCOHOL AND SMOKING:  Women should limit their alcohol intake to no  more than 7 drinks/beers/glasses of wine (combined, not each!) per week. Moderation of alcohol intake to this level decreases your risk of breast cancer and liver damage.  ( And of course, no recreational drugs are part of a healthy lifestyle.)  Also, you should not be smoking at all or even being exposed to second hand smoke. Most people know smoking can  cause cancer, and various heart and lung diseases, but did you know it also contributes to weakening of your bones?  Aging of your skin?  Yellowing of your teeth and nails?   CALCIUM AND VITAMIN D:  Adequate intake of calcium and Vitamin D are recommended.  The recommendations for exact amounts of these supplements seem to change often, but generally speaking 600 mg of calcium (either carbonate or citrate) and 800 units of Vitamin D per day seems prudent. Certain women may benefit from higher intake of Vitamin D.  If you are among these women, your doctor will have told you during your visit.     PAP SMEARS:  Pap smears, to check for cervical cancer or precancers,  have traditionally been done yearly, although recent scientific advances have shown that most women can have pap smears less often.  However, every woman still should have a physical exam from her gynecologist or primary care physician every year. It will include a breast check, inspection of the vulva and vagina to check for abnormal growths or skin changes, a visual exam of the cervix, and then an exam to evaluate the size and shape of the uterus and ovaries.  And after 62 years of age, a rectal exam is indicated to check for rectal cancers. We will also provide age appropriate advice regarding health maintenance, like when you should have certain vaccines, screening for sexually transmitted diseases, bone density testing, colonoscopy, mammograms, etc.    MAMMOGRAMS:  All women over 21 years old should have a yearly mammogram. Many facilities now offer a "3D" mammogram, which may cost  around $50 extra out of pocket. If possible,  we recommend you accept the option to have the 3D mammogram performed.  It both reduces the number of women who will be called back for extra views which then turn out to be normal, and it is better than the routine mammogram at detecting truly abnormal areas.     COLONOSCOPY:  Colonoscopy to screen for colon cancer is recommended for all women at age 55.  We know, you hate the idea of the prep.  We agree, BUT, having colon cancer and not knowing it is worse!!  Colon cancer so often starts as a polyp that can be seen and removed at colonscopy, which can quite literally save your life!  And if your first colonoscopy is normal and you have no family history of colon cancer, most women don't have to have it again for 10 years.  Once every ten years, you can do something that may end up saving your life, right?  We will be happy to help you get it scheduled when you are ready.  Be sure to check your insurance coverage so you understand how much it will cost.  It may be covered as a preventative service at no cost, but you should check your particular policy.   Please continue Lisinopril/HCTZ as directed- your blood pressure looks great! Referral to OB/GYN placed. Continue to drink water and follow heart healthy diet. Emmit Alexanders with your mother's move! 6 months follow-up, sooner if needed. NICE TO SEE YOU!

## 2017-05-21 NOTE — Assessment & Plan Note (Signed)
PAP completed today 

## 2017-05-21 NOTE — Assessment & Plan Note (Addendum)
1 bright red lesion noted to L of cervix. Lesion bleed with swept with vaginal swab. Discussed findings with pt, she reported "I may have told about this before" She cannot recall last contact with GYN Urgent referral to OB/GYN placed

## 2017-05-21 NOTE — Assessment & Plan Note (Signed)
BP at goal 119/74, HR 87 Continue Lisinopril/HCTZ 10/12.5mg  QD

## 2017-05-21 NOTE — Assessment & Plan Note (Signed)
Please continue Lisinopril/HCTZ as directed- your blood pressure looks great! Referral to OB/GYN placed. Continue to drink water and follow heart healthy diet. Lindsey Dyer with your mother's move! 6 months follow-up, sooner if needed.

## 2017-05-21 NOTE — Telephone Encounter (Signed)
Called and left a message for patient to call back to schedule an "urgent" new patient doctor referral appointment with our office for a cervical lesion.  Routing to [place] to assist patient with scheduling if I am not in the office.

## 2017-05-22 ENCOUNTER — Other Ambulatory Visit: Payer: Self-pay | Admitting: Adult Health

## 2017-05-22 DIAGNOSIS — Z1231 Encounter for screening mammogram for malignant neoplasm of breast: Secondary | ICD-10-CM

## 2017-05-22 NOTE — Telephone Encounter (Signed)
Patient called requesting to speak with Starla. Routing to [place] to return call and schedule.

## 2017-05-22 NOTE — Telephone Encounter (Signed)
Returning a call to patient ibuprofen left message on the voicemail to return my call for scheduling.

## 2017-05-25 ENCOUNTER — Other Ambulatory Visit: Payer: Self-pay

## 2017-05-25 ENCOUNTER — Encounter: Payer: Self-pay | Admitting: Obstetrics and Gynecology

## 2017-05-25 ENCOUNTER — Ambulatory Visit (INDEPENDENT_AMBULATORY_CARE_PROVIDER_SITE_OTHER): Payer: BLUE CROSS/BLUE SHIELD | Admitting: Obstetrics and Gynecology

## 2017-05-25 VITALS — BP 120/68 | HR 76 | Resp 14 | Ht 64.25 in | Wt 186.0 lb

## 2017-05-25 DIAGNOSIS — N952 Postmenopausal atrophic vaginitis: Secondary | ICD-10-CM | POA: Diagnosis not present

## 2017-05-25 DIAGNOSIS — N841 Polyp of cervix uteri: Secondary | ICD-10-CM

## 2017-05-25 LAB — CYTOLOGY - PAP
Diagnosis: NEGATIVE
HPV: NOT DETECTED

## 2017-05-25 NOTE — Progress Notes (Signed)
GYNECOLOGY  VISIT   HPI: 62 y.o.   Married  Caucasian  female   [id number] with Patient's last menstrual period was 02/25/2007 (within years).   here as a new patient referred from Mina Marble, NP at [place] [facility] for cervical lesion.  Picture on office visit shows area to the right of the os and the description states to the left of the cervix.  Bleeds with vaginal swab.  Used to see Adriana Simas and recalls something about monitoring a spot on her cervix for a couple of years.  No abnormal paps.  No prior procedures on the cervix, vagina, or uterus.  Used vaginal estrogen cream years ago.  No HRT.   No vaginal discharge or bleeding.       GYNECOLOGIC HISTORY: Patient's last menstrual period was 02/25/2007 (within years). Contraception:  Postmenopausal Menopausal hormone therapy:  none Last mammogram:  09/03/10 BIRADS 1 negative -- scheduled 07/07/17.  Has never done mammogram.  Has done thermograms. Last pap smear:   05/21/17 -- results not back        OB History    Gravida  2   Para  2   Term  0   Preterm  0   AB  0   Living  2     SAB  0   TAB  0   Ectopic  0   Multiple  0   Live Births  0              Patient Active Problem List   Diagnosis Date Noted  . Screening for cervical cancer 05/21/2017  . Cervical lesion 05/21/2017  . Hypertension 04/16/2017  . Healthcare maintenance 04/16/2017  . Lumbar radiculopathy 12/28/2015  . Strain of piriformis muscle, left, initial encounter 12/28/2015    Past Medical History:  Diagnosis Date  . Hypertension     Past Surgical History:  Procedure Laterality Date  . TONSILLECTOMY      Current Outpatient Medications  Medication Sig Dispense Refill  . lisinopril-hydrochlorothiazide (PRINZIDE,ZESTORETIC) 10-12.5 MG tablet Take 1 tablet by mouth daily. 90 tablet 2   No current facility-administered medications for this visit.      ALLERGIES: Patient has no known  allergies.  Family History  Problem Relation Age of Onset  . Hypertension Mother   . Stroke Father   . Healthy Brother   . Healthy Daughter   . Healthy Son   . Stroke Paternal Aunt   . Healthy Brother   . Cancer Brother        lung  . Cancer Brother        prostate    Social History   Socioeconomic History  . Marital status: Married    Spouse name: Not on file  . Number of children: Not on file  . Years of education: Not on file  . Highest education level: Not on file  Occupational History  . Not on file  Social Needs  . Financial resource strain: Not on file  . Food insecurity:    Worry: Not on file    Inability: Not on file  . Transportation needs:    Medical: Not on file    Non-medical: Not on file  Tobacco Use  . Smoking status: Never Smoker  . Smokeless tobacco: Never Used  Substance and Sexual Activity  . Alcohol use: No    Frequency: Never  . Drug use: No  . Sexual activity: Yes    Birth control/protection:  None, Post-menopausal  Lifestyle  . Physical activity:    Days per week: Not on file    Minutes per session: Not on file  . Stress: Not on file  Relationships  . Social connections:    Talks on phone: Not on file    Gets together: Not on file    Attends religious service: Not on file    Active member of club or organization: Not on file    Attends meetings of clubs or organizations: Not on file    Relationship status: Not on file  . Intimate partner violence:    Fear of current or ex partner: Not on file    Emotionally abused: Not on file    Physically abused: Not on file    Forced sexual activity: Not on file  Other Topics Concern  . Not on file  Social History Narrative  . Not on file    ROS:  Pertinent items are noted in HPI.  PHYSICAL EXAMINATION:    BP 120/68 (BP Location: Right Arm, Patient Position: Sitting, Cuff Size: Normal)   Pulse 76   Resp 14   Ht 5' 4.25" (1.632 m)   Wt 186 lb (84.4 kg)   LMP 02/25/2007 (Within Years)    BMI 31.68 kg/m     General appearance: alert, cooperative and appears stated age Head: Normocephalic, without obvious abnormality, atraumatic Neck: no adenopathy, supple, symmetrical, trachea midline and thyroid normal to inspection and palpation Lungs: clear to auscultation bilaterally Breasts: normal appearance, no masses or tenderness, No nipple retraction or dimpling, No nipple discharge or bleeding, No axillary or supraclavicular adenopathy Heart: regular rate and rhythm Abdomen: soft, non-tender, no masses,  no organomegaly Extremities: extremities normal, atraumatic, no cyanosis or edema Skin: Skin color, texture, turgor normal. No rashes or lesions Lymph nodes: Cervical, supraclavicular, and axillary nodes normal. No abnormal inguinal nodes palpated Neurologic: Grossly normal  Pelvic: External genitalia:  no lesions              Urethra:  normal appearing urethra with no masses, tenderness or lesions              Bartholins and Skenes: normal                 Vagina: normal appearing vagina with normal color and discharge, no lesions              Cervix:  Erythema and mucosal abrasion on the inferior cervix.  Right vaginal anterior mucosa with 3 mm area of erythema.  Cervical polyp also noted and this was removed with ring forceps after verbal permission.                 Bimanual Exam:  Uterus:  normal size, contour, position, consistency, mobility, non-tender              Adnexa: no mass, fullness, tenderness              Rectal exam: Yes.  .  Confirms.              Anus:  normal sphincter tone, no lesions  Chaperone was present for exam.  ASSESSMENT  Cervical polyp.  Vaginal/cervical atrophy.  Cervical lesion?  PLAN  Follow up pap.  Will ask for a copy of the report to be sent to me.  Cervical polyp to pathology.  Discussed atrophy of the vagina and cervix.  May just need some local vaginal estrogen cream and a recheck.  We will  help to move up her mammogram  appointment.  If the pap is abnormal, she will need a colposcopy.    An After Visit Summary was printed and given to the patient.  ___30___ minutes face to face time of which over 50% was spent in counseling.

## 2017-05-25 NOTE — Progress Notes (Signed)
Scheduled patient while in office for screening mammogram on 06[facility]02/2017 [facility]. Patient is agreeable to date and time.

## 2017-05-25 NOTE — Addendum Note (Signed)
Addended by: Yisroel Ramming, BROOK E on: 05/25/2017 11:18 AM   Modules accepted: Orders

## 2017-06-02 ENCOUNTER — Telehealth: Payer: Self-pay | Admitting: Obstetrics and Gynecology

## 2017-06-02 ENCOUNTER — Ambulatory Visit
Admission: RE | Admit: 2017-06-02 | Discharge: 2017-06-02 | Disposition: A | Discharge status: Home / Self Care | Payer: BLUE CROSS/BLUE SHIELD | Source: Ambulatory Visit | Attending: Adult Health | Admitting: Adult Health

## 2017-06-02 DIAGNOSIS — Z1231 Encounter for screening mammogram for malignant neoplasm of breast: Secondary | ICD-10-CM

## 2017-06-02 NOTE — Telephone Encounter (Signed)
Please contact patient in follow up to her visit here for a potential cervical lesion.  I suspected she has genital atrophy. Her pap and HR HPV were normal.  Her mammogram was normal. I am recommending she use Premarin vaginal cream 1/2 gram pv at hs every night for 2 weeks and then reduce to 1/2 gram pv at hs twice weekly. Dispense:  30 gram, RF one.  I would like to have her make an appointment for a recheck with me for 6 weeks.   Thank you.  [place], NP

## 2017-06-03 NOTE — Telephone Encounter (Signed)
Left message to call Rommel Hogston at [phone].  

## 2017-06-04 MED ORDER — ESTROGENS, CONJUGATED 0.625 MG/GM VA CREA: TOPICAL_CREAM | VAGINAL | 1 refills | Status: DC

## 2017-06-04 NOTE — Telephone Encounter (Signed)
Spoke with patient, advised as seen below per Dr. Quincy Simmonds. Rx for Premarin vaginal cream to verified pharmacy on file. Patient request 8am appt, scheduled for OV on 07/31/17 at 8am with Dr. Quincy Simmonds. Patient verbalizes understanding and is agreeable. Will close encounter.

## 2017-06-04 NOTE — Telephone Encounter (Signed)
Patient called to return call from [name].

## 2017-06-05 ENCOUNTER — Telehealth: Payer: Self-pay | Admitting: Obstetrics and Gynecology

## 2017-06-05 NOTE — Telephone Encounter (Signed)
Patient called stating that she was returning call from [facility]. Stated that she spoke with Sharee Pimple yesterday and was unsure if it was for the same issue.

## 2017-06-05 NOTE — Telephone Encounter (Signed)
Spoke with patient. See result note. Patient  Scheduled for 07/31/17 for 6 week recheck.

## 2017-06-17 ENCOUNTER — Telehealth: Payer: Self-pay | Admitting: Obstetrics and Gynecology

## 2017-06-17 NOTE — Telephone Encounter (Signed)
Left message regarding upcoming 6 week recheck appointment 07/31/17  has been canceled and needs to be rescheduled.

## 2017-07-07 ENCOUNTER — Ambulatory Visit: Payer: BLUE CROSS/BLUE SHIELD

## 2017-07-31 ENCOUNTER — Ambulatory Visit: Payer: Self-pay | Admitting: Obstetrics and Gynecology

## 2017-08-12 ENCOUNTER — Encounter: Payer: Self-pay | Admitting: Obstetrics and Gynecology

## 2017-08-12 ENCOUNTER — Ambulatory Visit (INDEPENDENT_AMBULATORY_CARE_PROVIDER_SITE_OTHER): Payer: BLUE CROSS/BLUE SHIELD | Admitting: Obstetrics and Gynecology

## 2017-08-12 ENCOUNTER — Other Ambulatory Visit: Payer: Self-pay

## 2017-08-12 VITALS — BP 122/74 | HR 84 | Resp 14 | Ht 65.0 in | Wt 186.0 lb

## 2017-08-12 DIAGNOSIS — N952 Postmenopausal atrophic vaginitis: Secondary | ICD-10-CM

## 2017-08-12 MED ORDER — ESTROGENS, CONJUGATED 0.625 MG/GM VA CREA: TOPICAL_CREAM | VAGINAL | 1 refills | Status: DC

## 2017-08-12 NOTE — Patient Instructions (Signed)
Atrophic Vaginitis Atrophic vaginitis is a condition in which the tissues that line the vagina become dry and thin. This condition is most common in women who have stopped having regular menstrual periods (menopause). This usually starts when a woman is 45-62 years old. Estrogen helps to keep the vagina moist. It stimulates the vagina to produce a clear fluid that lubricates the vagina for sexual intercourse. This fluid also protects the vagina from infection. Lack of estrogen can cause the lining of the vagina to get thinner and dryer. The vagina may also shrink in size. It may become less elastic. Atrophic vaginitis tends to get worse over time as a woman's estrogen level drops. What are the causes? This condition is caused by the normal drop in estrogen that happens around the time of menopause. What increases the risk? Certain conditions or situations may lower a woman's estrogen level, which increases her risk of atrophic vaginitis. These include:  Taking medicine that blocks estrogen.  Having ovaries removed surgically.  Being treated for cancer with X-ray treatment (radiation) or medicines (chemotherapy).  Exercising very hard and often.  Having an eating disorder (anorexia).  Giving birth or breastfeeding.  Being over the age of 50.  Smoking.  What are the signs or symptoms? Symptoms of this condition include:  Pain, soreness, or bleeding during sexual intercourse (dyspareunia).  Vaginal burning, irritation, or itching.  Pain or bleeding during a vaginal examination using a speculum (pelvic exam).  Loss of interest in sexual activity.  Having burning pain when passing urine.  Vaginal discharge that is brown or yellow.  In some cases, there are no symptoms. How is this diagnosed? This condition is diagnosed with a medical history and physical exam. This will include a pelvic exam that checks whether the inside of your vagina appears pale, thin, or dry. Rarely, you may  also have other tests, including:  A urine test.  A test that checks the acid balance in your vaginal fluid (acid balance test).  How is this treated? Treatment for this condition may depend on the severity of your symptoms. Treatment may include:  Using an over-the-counter vaginal lubricant before you have sexual intercourse.  Using a long-acting vaginal moisturizer.  Using low-dose vaginal estrogen for moderate to severe symptoms that do not respond to other treatments. Options include creams, tablets, and inserts (vaginal rings). Before using vaginal estrogen, tell your health care provider if you have a history of: ? Breast cancer. ? Endometrial cancer. ? Blood clots.  Taking medicines. You may be able to take a daily pill for dyspareunia. Discuss all of the risks of this medicine with your health care provider. It is usually not recommended for women who have a family history or personal history of breast cancer.  If your symptoms are very mild and you are not sexually active, you may not need treatment. Follow these instructions at home:  Take medicines only as directed by your health care provider. Do not use herbal or alternative medicines unless your health care provider says that you can.  Use over-the-counter creams, lubricants, or moisturizers for dryness only as directed by your health care provider.  If your atrophic vaginitis is caused by menopause, discuss all of your menopausal symptoms and treatment options with your health care provider.  Do not douche.  Do not use products that can make your vagina dry. These include: ? Scented feminine sprays. ? Scented tampons. ? Scented soaps.  If it hurts to have sex, talk with your sexual   partner. Contact a health care provider if:  Your discharge looks different than normal.  Your vagina has an unusual smell.  You have new symptoms.  Your symptoms do not improve with treatment.  Your symptoms get worse. This  information is not intended to replace advice given to you by your health care provider. Make sure you discuss any questions you have with your health care provider. Document Released: 06/27/2014 Document Revised: 07/19/2015 Document Reviewed: 02/01/2014 [employer] Interactive Patient Education  2018 [employer]  

## 2017-08-12 NOTE — Progress Notes (Signed)
GYNECOLOGY  VISIT   HPI: 62 y.o.   Married  Caucasian  female   [id number] with Patient's last menstrual period was 02/25/2007 (within years).   here for   6week f/u.  Seen for visit on 05/25/17 and was noted to have erythema and mucosal abrasion on the inferior cervix. Right anterior vaginal mucosal erythema also.  She had a cervical polyp removed at the same time, and the pathology was benign.  Was treated for vaginal atrophy with Premarin cream.  Is still using twice a week.  Not noticing any change with respect to Sexual functioning.   GYNECOLOGIC HISTORY: Patient's last menstrual period was 02/25/2007 (within years). Contraception:  postmenopausal Menopausal hormone therapy:  Premarin vaginal cream Last mammogram:  06-02-17 category b density birads 1:neg Last pap smear:   05-21-17 neg HPV HR neg        OB History    Gravida  2   Para  2   Term  0   Preterm  0   AB  0   Living  2     SAB  0   TAB  0   Ectopic  0   Multiple  0   Live Births  0              Patient Active Problem List   Diagnosis Date Noted  . Screening for cervical cancer 05/21/2017  . Cervical lesion 05/21/2017  . Hypertension 04/16/2017  . Healthcare maintenance 04/16/2017  . Lumbar radiculopathy 12/28/2015  . Strain of piriformis muscle, left, initial encounter 12/28/2015    Past Medical History:  Diagnosis Date  . Hypertension     Past Surgical History:  Procedure Laterality Date  . TONSILLECTOMY      Current Outpatient Medications  Medication Sig Dispense Refill  . conjugated estrogens (PREMARIN) vaginal cream Use 1/2 gram pv at hs twice weekly 30 g 1  . lisinopril-hydrochlorothiazide (PRINZIDE,ZESTORETIC) 10-12.5 MG tablet Take 1 tablet by mouth daily. 90 tablet 2   No current facility-administered medications for this visit.      ALLERGIES: Patient has no known allergies.  Family History  Problem Relation Age of Onset  . Hypertension Mother   . Stroke Father   .  Healthy Brother   . Healthy Daughter   . Healthy Son   . Stroke Paternal Aunt   . Healthy Brother   . Cancer Brother        lung  . Cancer Brother        prostate    Social History   Socioeconomic History  . Marital status: Married    Spouse name: Not on file  . Number of children: Not on file  . Years of education: Not on file  . Highest education level: Not on file  Occupational History  . Not on file  Social Needs  . Financial resource strain: Not on file  . Food insecurity:    Worry: Not on file    Inability: Not on file  . Transportation needs:    Medical: Not on file    Non-medical: Not on file  Tobacco Use  . Smoking status: Never Smoker  . Smokeless tobacco: Never Used  Substance and Sexual Activity  . Alcohol use: No    Frequency: Never  . Drug use: No  . Sexual activity: Yes    Birth control/protection: None, Post-menopausal  Lifestyle  . Physical activity:    Days per week: Not on file    Minutes per  session: Not on file  . Stress: Not on file  Relationships  . Social connections:    Talks on phone: Not on file    Gets together: Not on file    Attends religious service: Not on file    Active member of club or organization: Not on file    Attends meetings of clubs or organizations: Not on file    Relationship status: Not on file  . Intimate partner violence:    Fear of current or ex partner: Not on file    Emotionally abused: Not on file    Physically abused: Not on file    Forced sexual activity: Not on file  Other Topics Concern  . Not on file  Social History Narrative  . Not on file    Review of Systems  Constitutional: Negative.   HENT: Negative.   Eyes: Negative.   Respiratory: Negative.   Cardiovascular: Negative.   Gastrointestinal: Negative.   Endocrine: Negative.   Genitourinary: Negative.   Musculoskeletal: Negative.   Skin: Negative.   Allergic/Immunologic: Negative.   Neurological: Negative.   Hematological: Negative.    Psychiatric/Behavioral: Negative.     PHYSICAL EXAMINATION:    BP 122/74 (BP Location: Right Arm, Patient Position: Sitting, Cuff Size: Normal)   Pulse 84   Resp 14   Ht 5\' 5"  (1.651 m)   Wt 186 lb (84.4 kg)   LMP 02/25/2007 (Within Years)   BMI 30.95 kg/m     General appearance: alert, cooperative and appears stated age    Pelvic: External genitalia:  no lesions              Urethra:  normal appearing urethra with no masses, tenderness or lesions              Bartholins and Skenes: normal                 Vagina: normal appearing vagina with normal color and discharge, no lesions              Cervix: no lesions.  Mild erythema of anterior and posterior cervix.   Cervix mildly friable with palpation with Q-tip.                Bimanual Exam:  Uterus:  normal size, contour, position, consistency, mobility, non-tender              Adnexa: no mass, fullness, tenderness              Chaperone was present for exam.  ASSESSMENT  Vaginal/cervical atrophy.  Negative cervical cancer screening.  Negative HR HPV testing.   PLAN  We talked about atrophy of tissues.  I recommend continued use of Premarin vaginal cream 1/2 gm pv at hs twice weekly. Potential effect on breast cancer discussed.  We did discuss alternatives being Vagifem and Estring.  Follow up pap with HR HPV testing in one year.  She will see her PCP.  Call for any vaginal bleeding other than mild spotting following a pelvic exam.  Questions invited and answered.   An After Visit Summary was printed and given to the patient.  ___15___ minutes face to face time of which over 50% was spent in counseling.

## 2017-09-25 DIAGNOSIS — D225 Melanocytic nevi of trunk: Secondary | ICD-10-CM | POA: Diagnosis not present

## 2017-09-25 DIAGNOSIS — L821 Other seborrheic keratosis: Secondary | ICD-10-CM | POA: Diagnosis not present

## 2017-09-25 DIAGNOSIS — D2261 Melanocytic nevi of right upper limb, including shoulder: Secondary | ICD-10-CM | POA: Diagnosis not present

## 2017-09-25 DIAGNOSIS — D2262 Melanocytic nevi of left upper limb, including shoulder: Secondary | ICD-10-CM | POA: Diagnosis not present

## 2017-10-02 ENCOUNTER — Encounter: Payer: Self-pay | Admitting: Gastroenterology

## 2017-10-14 ENCOUNTER — Encounter: Payer: Self-pay | Admitting: Gastroenterology

## 2017-11-19 NOTE — Progress Notes (Signed)
Subjective:    Patient ID: Lindsey Dyer, female    DOB: May 03, 1955, 62 y.o.   MRN: [id number]  HPI: 04/16/17 OV:  Lindsey Dyer is here to establish as a new pt.  She is a pleasant 62 year old female.  PMH: HTN, Hypothyroidism, and Obesity.  She has been off Lisinopril/HCTZ for couple of weeks and denies CP/dyspnea/palpitations.  She estimates to drink >80 oz water/day and follows a low CHO/saturated fat diet. She walks 15 mins, 3 times a day- weather permitting. She has hx of L piriformis muscle strain with lumbar radiculopathy- none at present. She denies acute complaints today and over feels that her health is " is quite good". She denies tobacco/ETOH use  05/21/17 OV: Lindsey Dyer presents for CPE. She remains off [place] Thyroid and denies fatigue/hair loss. She has been taking Lisinopril/HCTZ as directed, denies CP/dyspnea/palpitations. She denies acute complaints today.  Healthcare Maintenance: PAP- completed today Mammogram- order placed Colonoscopy- last completed 2009, due this year  11/23/17 OV: Lindsey Dyer is here for regular f/u: HTN, Obesity, and Vaginal atrophy She was referred to GYN for cervical polyp 05/2017, pathology was ultimately benign. She was treated for vaginal atrophy with Premarin cream-she is tolerating well. She has been walking 3 times/day, for total >40mins/day She has been drinking >70 oz water/day and reducing CHO/sugar in diet. She has lost 6 lbs since June 2019! She denies acute complaints today  Patient Care Team    Relationship Specialty Notifications Start End  Esaw Grandchild, NP PCP - General Family Medicine  04/16/17     Patient Active Problem List   Diagnosis Date Noted  . Vaginal atrophy 11/23/2017  . Screening for cervical cancer 05/21/2017  . Cervical lesion 05/21/2017  . Hypertension 04/16/2017  . Healthcare maintenance 04/16/2017  . Lumbar radiculopathy 12/28/2015  . Strain of piriformis muscle, left, initial encounter 12/28/2015      Past Medical History:  Diagnosis Date  . Hypertension      Past Surgical History:  Procedure Laterality Date  . TONSILLECTOMY       Family History  Problem Relation Age of Onset  . Hypertension Mother   . Stroke Father   . Healthy Brother   . Healthy Daughter   . Healthy Son   . Stroke Paternal Aunt   . Healthy Brother   . Cancer Brother        lung  . Cancer Brother        prostate     Social History   Substance and Sexual Activity  Drug Use No     Social History   Substance and Sexual Activity  Alcohol Use No  . Frequency: Never     Social History   Tobacco Use  Smoking Status Never Smoker  Smokeless Tobacco Never Used     Outpatient Encounter Medications as of 11/23/2017  Medication Sig  . conjugated estrogens (PREMARIN) vaginal cream Use 1/2 gram pv at hs twice weekly  . lisinopril-hydrochlorothiazide (PRINZIDE,ZESTORETIC) 10-12.5 MG tablet Take 1 tablet by mouth daily.   No facility-administered encounter medications on file as of 11/23/2017.     Allergies: Patient has no known allergies.  Body mass index is 29.97 kg/m.  Blood pressure 125/74, pulse 76, height 5\' 5"  (1.651 m), weight 180 lb 1.6 oz (81.7 kg), last menstrual period 02/25/2007, SpO2 98 %.  Review of Systems  Constitutional: Negative for activity change, appetite change, chills, diaphoresis, fatigue, fever and unexpected weight change.  HENT: Negative for congestion.  Eyes: Negative for visual disturbance.  Respiratory: Negative for cough, chest tightness, shortness of breath, wheezing and stridor.   Cardiovascular: Negative for chest pain, palpitations and leg swelling.  Gastrointestinal: Negative for abdominal distention, abdominal pain, blood in stool, constipation, diarrhea, nausea, rectal pain and vomiting.  Endocrine: Negative for cold intolerance, heat intolerance, polydipsia, polyphagia and polyuria.  Genitourinary: Negative for difficulty urinating and flank  pain.  Musculoskeletal: Negative for arthralgias, back pain, gait problem, joint swelling, myalgias, neck pain and neck stiffness.  Skin: Negative for color change, pallor, rash and wound.  Neurological: Negative for dizziness and headaches.  Hematological: Does not bruise/bleed easily.  Psychiatric/Behavioral: Negative for dysphoric mood, hallucinations, self-injury, sleep disturbance and suicidal ideas. The patient is not nervous/anxious and is not hyperactive.        Objective:    General: Well Developed, well nourished, appropriate for stated age.  Neuro: Alert and oriented, extra-ocular muscles intact HEENT: Normocephalic, atraumatic, pupils equal round reactive, neck supple Skin: Warm, pink and dry, no gross rash. Cardiac: rate regular Respiratory: Not using accessory muscles, speaking in full sentences-unlabored. Vascular:  cap RF less 2 sec. Psych: No HI/SI, judgement and insight good, Euthymic mood. Full Affect.      Assessment & Plan:   1. Hypertension, unspecified type   2. Healthcare maintenance   3. Vaginal atrophy     Hypertension BP at goal 125/74, HR 76 Continue Prinzide 10/12.5mg  QD 6 lbs wt los since June 2019!  Healthcare maintenance Continue all medications as directed. Continue to drink plenty of water and follow Mediterranean diet. Continue regular walking. Please schedule complete physical with fasting labs in 6 months.  Vaginal atrophy Treated with Premarin Cream   FOLLOW-UP:  Return in about 6 months (around 05/24/2018) for CPE, Fasting Labs.

## 2017-11-23 ENCOUNTER — Ambulatory Visit (INDEPENDENT_AMBULATORY_CARE_PROVIDER_SITE_OTHER): Payer: BLUE CROSS/BLUE SHIELD | Admitting: Adult Health

## 2017-11-23 ENCOUNTER — Encounter: Payer: Self-pay | Admitting: Adult Health

## 2017-11-23 VITALS — BP 125/74 | HR 76 | Ht 65.0 in | Wt 180.1 lb

## 2017-11-23 DIAGNOSIS — I10 Essential (primary) hypertension: Secondary | ICD-10-CM | POA: Diagnosis not present

## 2017-11-23 DIAGNOSIS — Z0000 Encounter for general adult medical examination without abnormal findings: Secondary | ICD-10-CM | POA: Diagnosis not present

## 2017-11-23 DIAGNOSIS — N952 Postmenopausal atrophic vaginitis: Secondary | ICD-10-CM | POA: Diagnosis not present

## 2017-11-23 NOTE — Patient Instructions (Addendum)
Hypertension Hypertension, commonly called high blood pressure, is when the force of blood pumping through the arteries is too strong. The arteries are the blood vessels that carry blood from the heart throughout the body. Hypertension forces the heart to work harder to pump blood and may cause arteries to become narrow or stiff. Having untreated or uncontrolled hypertension can cause heart attacks, strokes, kidney disease, and other problems. A blood pressure reading consists of a higher number over a lower number. Ideally, your blood pressure should be below 120/80. The first ("top") number is called the systolic pressure. It is a measure of the pressure in your arteries as your heart beats. The second ("bottom") number is called the diastolic pressure. It is a measure of the pressure in your arteries as the heart relaxes. What are the causes? The cause of this condition is not known. What increases the risk? Some risk factors for high blood pressure are under your control. Others are not. Factors you can change  Smoking.  Having type 2 diabetes mellitus, high cholesterol, or both.  Not getting enough exercise or physical activity.  Being overweight.  Having too much fat, sugar, calories, or salt (sodium) in your diet.  Drinking too much alcohol. Factors that are difficult or impossible to change  Having chronic kidney disease.  Having a family history of high blood pressure.  Age. Risk increases with age.  Race. You may be at higher risk if you are African-American.  Gender. Men are at higher risk than women before age 45. After age 65, women are at higher risk than men.  Having obstructive sleep apnea.  Stress. What are the signs or symptoms? Extremely high blood pressure (hypertensive crisis) may cause:  Headache.  Anxiety.  Shortness of breath.  Nosebleed.  Nausea and vomiting.  Severe chest pain.  Jerky movements you cannot control (seizures).  How is this  diagnosed? This condition is diagnosed by measuring your blood pressure while you are seated, with your arm resting on a surface. The cuff of the blood pressure monitor will be placed directly against the skin of your upper arm at the level of your heart. It should be measured at least twice using the same arm. Certain conditions can cause a difference in blood pressure between your right and left arms. Certain factors can cause blood pressure readings to be lower or higher than normal (elevated) for a short period of time:  When your blood pressure is higher when you are in a health care provider's office than when you are at home, this is called white coat hypertension. Most people with this condition do not need medicines.  When your blood pressure is higher at home than when you are in a health care provider's office, this is called masked hypertension. Most people with this condition may need medicines to control blood pressure.  If you have a high blood pressure reading during one visit or you have normal blood pressure with other risk factors:  You may be asked to return on a different day to have your blood pressure checked again.  You may be asked to monitor your blood pressure at home for 1 week or longer.  If you are diagnosed with hypertension, you may have other blood or imaging tests to help your health care provider understand your overall risk for other conditions. How is this treated? This condition is treated by making healthy lifestyle changes, such as eating healthy foods, exercising more, and reducing your alcohol intake. Your   health care provider may prescribe medicine if lifestyle changes are not enough to get your blood pressure under control, and if:  Your systolic blood pressure is above 130.  Your diastolic blood pressure is above 80.  Your personal target blood pressure may vary depending on your medical conditions, your age, and other factors. Follow these  instructions at home: Eating and drinking  Eat a diet that is high in fiber and potassium, and low in sodium, added sugar, and fat. An example eating plan is called the DASH (Dietary Approaches to Stop Hypertension) diet. To eat this way: ? Eat plenty of fresh fruits and vegetables. Try to fill half of your plate at each meal with fruits and vegetables. ? Eat whole grains, such as whole wheat pasta, brown rice, or whole grain bread. Fill about one quarter of your plate with whole grains. ? Eat or drink low-fat dairy products, such as skim milk or low-fat yogurt. ? Avoid fatty cuts of meat, processed or cured meats, and poultry with skin. Fill about one quarter of your plate with lean proteins, such as fish, chicken without skin, beans, eggs, and tofu. ? Avoid premade and processed foods. These tend to be higher in sodium, added sugar, and fat.  Reduce your daily sodium intake. Most people with hypertension should eat less than 1,500 mg of sodium a day.  Limit alcohol intake to no more than 1 drink a day for nonpregnant women and 2 drinks a day for men. One drink equals 12 oz of beer, 5 oz of wine, or 1 oz of hard liquor. Lifestyle  Work with your health care provider to maintain a healthy body weight or to lose weight. Ask what an ideal weight is for you.  Get at least 30 minutes of exercise that causes your heart to beat faster (aerobic exercise) most days of the week. Activities may include walking, swimming, or biking.  Include exercise to strengthen your muscles (resistance exercise), such as pilates or lifting weights, as part of your weekly exercise routine. Try to do these types of exercises for 30 minutes at least 3 days a week.  Do not use any products that contain nicotine or tobacco, such as cigarettes and e-cigarettes. If you need help quitting, ask your health care provider.  Monitor your blood pressure at home as told by your health care provider.  Keep all follow-up visits as  told by your health care provider. This is important. Medicines  Take over-the-counter and prescription medicines only as told by your health care provider. Follow directions carefully. Blood pressure medicines must be taken as prescribed.  Do not skip doses of blood pressure medicine. Doing this puts you at risk for problems and can make the medicine less effective.  Ask your health care provider about side effects or reactions to medicines that you should watch for. Contact a health care provider if:  You think you are having a reaction to a medicine you are taking.  You have headaches that keep coming back (recurring).  You feel dizzy.  You have swelling in your ankles.  You have trouble with your vision. Get help right away if:  You develop a severe headache or confusion.  You have unusual weakness or numbness.  You feel faint.  You have severe pain in your chest or abdomen.  You vomit repeatedly.  You have trouble breathing. Summary  Hypertension is when the force of blood pumping through your arteries is too strong. If this condition is not   controlled, it may put you at risk for serious complications.  Your personal target blood pressure may vary depending on your medical conditions, your age, and other factors. For most people, a normal blood pressure is less than 120/80.  Hypertension is treated with lifestyle changes, medicines, or a combination of both. Lifestyle changes include weight loss, eating a healthy, low-sodium diet, exercising more, and limiting alcohol. This information is not intended to replace advice given to you by your health care provider. Make sure you discuss any questions you have with your health care provider. Document Released: 02/10/2005 Document Revised: 01/09/2016 Document Reviewed: 01/09/2016 Elsevier Interactive Patient Education  2018 [place] refers to food and lifestyle choices that  are based on the traditions of countries located on the [employer]. This way of eating has been shown to help prevent certain conditions and improve outcomes for people who have chronic diseases, like kidney disease and heart disease. What are tips for following this plan? Lifestyle  Cook and eat meals together with your family, when possible.  Drink enough fluid to keep your urine clear or pale yellow.  Be physically active every day. This includes: ? Aerobic exercise like running or swimming. ? Leisure activities like gardening, walking, or housework.  Get 7-8 hours of sleep each night.  If recommended by your health care provider, drink red wine in moderation. This means 1 glass a day for nonpregnant women and 2 glasses a day for men. A glass of wine equals 5 oz (150 mL). Reading food labels  Check the serving size of packaged foods. For foods such as rice and pasta, the serving size refers to the amount of cooked product, not dry.  Check the total fat in packaged foods. Avoid foods that have saturated fat or trans fats.  Check the ingredients list for added sugars, such as corn syrup. Shopping  At the grocery store, buy most of your food from the areas near the walls of the store. This includes: ? Fresh fruits and vegetables (produce). ? Grains, beans, nuts, and seeds. Some of these may be available in unpackaged forms or large amounts (in bulk). ? Fresh seafood. ? Poultry and eggs. ? Low-fat dairy products.  Buy whole ingredients instead of prepackaged foods.  Buy fresh fruits and vegetables in-season from local farmers markets.  Buy frozen fruits and vegetables in resealable bags.  If you do not have access to quality fresh seafood, buy precooked frozen shrimp or canned fish, such as tuna, salmon, or sardines.  Buy small amounts of raw or cooked vegetables, salads, or olives from the deli or salad bar at your store.  Stock your pantry so you always have certain  foods on hand, such as olive oil, canned tuna, canned tomatoes, rice, pasta, and beans. Cooking  Cook foods with extra-virgin olive oil instead of using butter or other vegetable oils.  Have meat as a side dish, and have vegetables or grains as your main dish. This means having meat in small portions or adding small amounts of meat to foods like pasta or stew.  Use beans or vegetables instead of meat in common dishes like chili or lasagna.  Experiment with different cooking methods. Try roasting or broiling vegetables instead of steaming or sauteing them.  Add frozen vegetables to soups, stews, pasta, or rice.  Add nuts or seeds for added healthy fat at each meal. You can add these to yogurt, salads, or vegetable dishes.  Marinate  fish or vegetables using olive oil, lemon juice, garlic, and fresh herbs. Meal planning  Plan to eat 1 vegetarian meal one day each week. Try to work up to 2 vegetarian meals, if possible.  Eat seafood 2 or more times a week.  Have healthy snacks readily available, such as: ? Vegetable sticks with hummus. ? [place] yogurt. ? Fruit and nut trail mix.  Eat balanced meals throughout the week. This includes: ? Fruit: 2-3 servings a day ? Vegetables: 4-5 servings a day ? Low-fat dairy: 2 servings a day ? Fish, poultry, or lean meat: 1 serving a day ? Beans and legumes: 2 or more servings a week ? Nuts and seeds: 1-2 servings a day ? Whole grains: 6-8 servings a day ? Extra-virgin olive oil: 3-4 servings a day  Limit red meat and sweets to only a few servings a month What are my food choices?  Mediterranean diet ? Recommended ? Grains: Whole-grain pasta. Brown rice. Bulgar wheat. Polenta. Couscous. Whole-wheat bread. [place]. ? Vegetables: Artichokes. Beets. Broccoli. Cabbage. Carrots. Eggplant. Green beans. Chard. Kale. Spinach. Onions. Leeks. Peas. Squash. Tomatoes. Peppers. Radishes. ? Fruits: Apples. Apricots. Avocado. Berries. Bananas.  [place]. Dates. Figs. Grapes. Lemons. Melon. Oranges. Peaches. Plums. Pomegranate. ? Meats and other protein foods: Beans. Almonds. Sunflower seeds. Pine nuts. Peanuts. [place]. Salmon. Scallops. Shrimp. [place]. Tilapia. Clams. Oysters. Eggs. ? Dairy: Low-fat milk. Cheese. Greek yogurt. ? Beverages: Water. Red wine. Herbal tea. ? Fats and oils: Extra virgin olive oil. Avocado oil. Grape seed oil. ? Sweets and desserts: [place] yogurt with honey. Baked apples. Poached pears. Trail mix. ? Seasoning and other foods: Basil. Cilantro. Coriander. Cumin. Mint. Parsley. Sage. Rosemary. Tarragon. Garlic. Oregano. Thyme. Pepper. Balsalmic vinegar. Tahini. Hummus. Tomato sauce. Olives. Mushrooms. ? Limit these ? Grains: Prepackaged pasta or rice dishes. Prepackaged cereal with added sugar. ? Vegetables: Deep fried potatoes (french fries). ? Fruits: Fruit canned in syrup. ? Meats and other protein foods: Beef. Pork. Lamb. Poultry with skin. Hot dogs. [place] Salines. ? Dairy: Ice cream. Sour cream. Whole milk. ? Beverages: Juice. Sugar-sweetened soft drinks. Beer. Liquor and spirits. ? Fats and oils: Butter. Canola oil. Vegetable oil. Beef fat (tallow). Lard. ? Sweets and desserts: Cookies. Cakes. Pies. Candy. ? Seasoning and other foods: Mayonnaise. Premade sauces and marinades. ? The items listed may not be a complete list. Talk with your dietitian about what dietary choices are right for you. Summary  The Mediterranean diet includes both food and lifestyle choices.  Eat a variety of fresh fruits and vegetables, beans, nuts, seeds, and whole grains.  Limit the amount of red meat and sweets that you eat.  Talk with your health care provider about whether it is safe for you to drink red wine in moderation. This means 1 glass a day for nonpregnant women and 2 glasses a day for men. A glass of wine equals 5 oz (150 mL). This information is not intended to replace advice given to you by your health care provider. Make  sure you discuss any questions you have with your health care provider. Document Released: 10/04/2015 Document Revised: 11/06/2015 Document Reviewed: 10/04/2015 Elsevier Interactive Patient Education  2018 [place] all medications as directed. Continue to drink plenty of water and follow Mediterranean diet. Continue regular walking. Please schedule complete physical with fasting labs in 6 months. GREAT JOB ON THE WEIGHT LOSS AND BLOOD PRESSURE! NICE TO SEE YOU!

## 2017-11-23 NOTE — Assessment & Plan Note (Signed)
BP at goal 125/74, HR 76 Continue Prinzide 10/12.5mg  QD 6 lbs wt los since June 2019!

## 2017-11-23 NOTE — Assessment & Plan Note (Signed)
Continue all medications as directed. Continue to drink plenty of water and follow Mediterranean diet. Continue regular walking. Please schedule complete physical with fasting labs in 6 months.

## 2017-11-23 NOTE — Assessment & Plan Note (Signed)
Treated with Premarin Cream

## 2017-11-27 ENCOUNTER — Ambulatory Visit (AMBULATORY_SURGERY_CENTER): Payer: Self-pay | Admitting: *Deleted

## 2017-11-27 ENCOUNTER — Encounter: Payer: Self-pay | Admitting: Gastroenterology

## 2017-11-27 VITALS — Ht 65.0 in | Wt 182.0 lb

## 2017-11-27 DIAGNOSIS — Z1211 Encounter for screening for malignant neoplasm of colon: Secondary | ICD-10-CM

## 2017-11-27 MED ORDER — NA SULFATE-K SULFATE-MG SULF 17.5-3.13-1.6 GM/177ML PO SOLN: ORAL | 0 refills | Status: DC

## 2017-11-27 NOTE — Progress Notes (Signed)
Patient denies any allergies to eggs or soy. Patient denies any problems with anesthesia/sedation. Patient denies any oxygen use at home. Patient denies taking any diet/weight loss medications or blood thinners. EMMI education offered, pt declined. Suprep $50 coupon given to pt.  

## 2017-12-11 ENCOUNTER — Ambulatory Visit (AMBULATORY_SURGERY_CENTER): Payer: BLUE CROSS/BLUE SHIELD | Admitting: Gastroenterology

## 2017-12-11 ENCOUNTER — Encounter: Payer: Self-pay | Admitting: Gastroenterology

## 2017-12-11 VITALS — BP 126/75 | HR 75 | Temp 98.0°F | Resp 13 | Ht 65.0 in | Wt 180.0 lb

## 2017-12-11 DIAGNOSIS — D122 Benign neoplasm of ascending colon: Secondary | ICD-10-CM

## 2017-12-11 DIAGNOSIS — D123 Benign neoplasm of transverse colon: Secondary | ICD-10-CM

## 2017-12-11 DIAGNOSIS — Z1211 Encounter for screening for malignant neoplasm of colon: Secondary | ICD-10-CM | POA: Diagnosis not present

## 2017-12-11 DIAGNOSIS — K635 Polyp of colon: Secondary | ICD-10-CM | POA: Diagnosis not present

## 2017-12-11 DIAGNOSIS — D128 Benign neoplasm of rectum: Secondary | ICD-10-CM | POA: Diagnosis not present

## 2017-12-11 MED ORDER — SODIUM CHLORIDE 0.9 % IV SOLN: 500.0000 mL | Freq: Once | INTRAVENOUS | Status: DC

## 2017-12-11 NOTE — Op Note (Signed)
[facility] Patient Name: Lindsey Dyer Procedure Date: 12/11/2017 8:10 AM MRN: [id number] Endoscopist: Remo Lipps P. Havery Moros , MD Age: 62 Referring MD:  Date of Birth: 05-31-1955 Gender: Female Account #: [id number] Procedure:                Colonoscopy Indications:              Screening for colorectal malignant neoplasm Medicines:                Monitored Anesthesia Care Procedure:                Pre-Anesthesia Assessment:                           - Prior to the procedure, a History and Physical                            was performed, and patient medications and                            allergies were reviewed. The patient's tolerance of                            previous anesthesia was also reviewed. The risks                            and benefits of the procedure and the sedation                            options and risks were discussed with the patient.                            All questions were answered, and informed consent                            was obtained. Prior Anticoagulants: The patient has                            taken no previous anticoagulant or antiplatelet                            agents. ASA Grade Assessment: II - A patient with                            mild systemic disease. After reviewing the risks                            and benefits, the patient was deemed in                            satisfactory condition to undergo the procedure.                           After obtaining informed consent, the colonoscope  was passed under direct vision. Throughout the                            procedure, the patient's blood pressure, pulse, and                            oxygen saturations were monitored continuously. The                            Colonoscope was introduced through the anus and                            advanced to the the cecum, identified by                            appendiceal orifice  and ileocecal valve. The                            colonoscopy was performed without difficulty. The                            patient tolerated the procedure well. The quality                            of the bowel preparation was good. The ileocecal                            valve, appendiceal orifice, and rectum were                            photographed. Scope In: 8:19:54 AM Scope Out: 8:47:02 AM Scope Withdrawal Time: 0 hours 22 minutes 50 seconds  Total Procedure Duration: 0 hours 27 minutes 8 seconds  Findings:                 The perianal and digital rectal examinations were                            normal.                           A 4 to 5 mm polyp was found in the ascending colon.                            The polyp was flat. The polyp was removed with a                            cold snare. Resection and retrieval were complete.                           A 3 mm polyp was found in the hepatic flexure. The                            polyp was sessile. The polyp was removed with  a                            cold snare. Resection and retrieval were complete.                           A 5 mm polyp was found in the transverse colon. The                            polyp was sessile. The polyp was removed with a                            cold snare. Resection and retrieval were complete.                           A 3 to 4 mm polyp was found in the rectum. The                            polyp was sessile. The polyp was removed with a                            cold snare. Resection and retrieval were complete.                           Anal papilla(e) were hypertrophied.                           The colon was tortous. The exam was otherwise                            normal throughout the examined colon. Complications:            No immediate complications. Estimated blood loss:                            Minimal. Estimated Blood Loss:     Estimated blood loss was  minimal. Impression:               - One 4 to 5 mm polyp in the ascending colon,                            removed with a cold snare. Resected and retrieved.                           - One 3 mm polyp at the hepatic flexure, removed                            with a cold snare. Resected and retrieved.                           - One 5 mm polyp in the transverse colon, removed                            with  a cold snare. Resected and retrieved.                           - One 3 to 4 mm polyp in the rectum, removed with a                            cold snare. Resected and retrieved.                           - Anal papilla(e) were hypertrophied. Recommendation:           - Patient has a contact number available for                            emergencies. The signs and symptoms of potential                            delayed complications were discussed with the                            patient. Return to normal activities tomorrow.                            Written discharge instructions were provided to the                            patient.                           - Resume previous diet.                           - Continue present medications.                           - Await pathology results with further                            recommendations. Remo Lipps P. Nikeria Kalman, MD 12/11/2017 8:51:21 AM This report has been signed electronically.

## 2017-12-11 NOTE — Progress Notes (Signed)
A/ox3 pleased with MAC, report to RN 

## 2017-12-11 NOTE — Progress Notes (Signed)
Called to room to assist during endoscopic procedure.  Patient ID and intended procedure confirmed with present staff. Received instructions for my participation in the procedure from the performing physician.  

## 2017-12-14 ENCOUNTER — Telehealth: Payer: Self-pay

## 2017-12-14 ENCOUNTER — Telehealth: Payer: Self-pay | Admitting: *Deleted

## 2017-12-14 NOTE — Telephone Encounter (Signed)
  Follow up Call-  Call back number 12/11/2017  Post procedure Call Back phone  # [phone]  Permission to leave phone message Yes  Some recent data might be hidden     Patient questions:  Do you have a fever, pain , or abdominal swelling? No. Pain Score  0 *  Have you tolerated food without any problems? Yes.    Have you been able to return to your normal activities? Yes.    Do you have any questions about your discharge instructions: Diet   No. Medications  No. Follow up visit  No.  Do you have questions or concerns about your Care? No.  Actions: * If pain score is 4 or above: No action needed, pain <4.

## 2017-12-14 NOTE — Telephone Encounter (Signed)
Attempted to reach pt. With follow-up call following endoscopic procedure 12/11/2017.  LM on pt. Voice mail.  Will try to reach pt. Again later today.

## 2018-03-29 ENCOUNTER — Other Ambulatory Visit: Payer: Self-pay | Admitting: Adult Health

## 2018-04-20 IMAGING — DX DG LUMBAR SPINE COMPLETE 4+V
5 series · 5 of 5 positions shown · non-contrast
Comparison: Coronal and sagittal CT images through the lumbar spine
from a scan of August 09, 2008

CLINICAL DATA: Lower back pain and sharp pain radiating into the
left leg for the past 2 weeks without known injury

EXAM:
LUMBAR SPINE - COMPLETE 4+ VIEW

[l-spine ap]
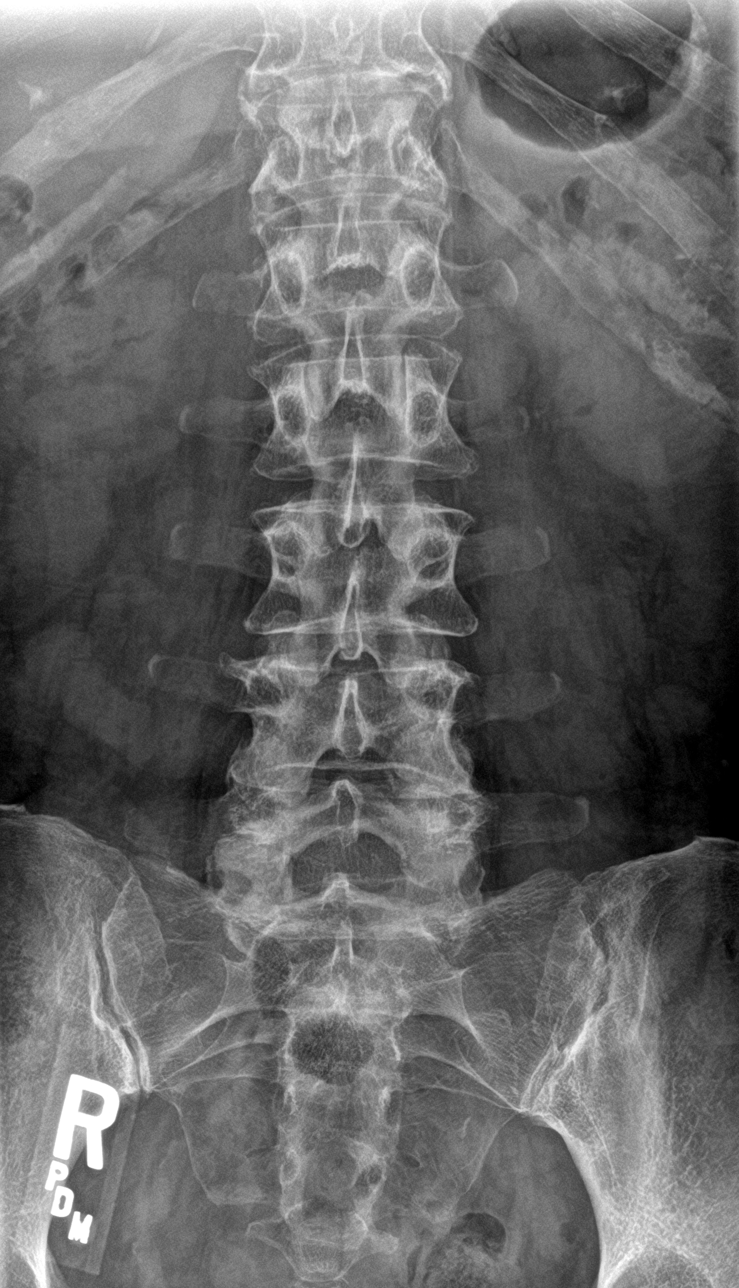

[l-spine obl (1 of 2)]
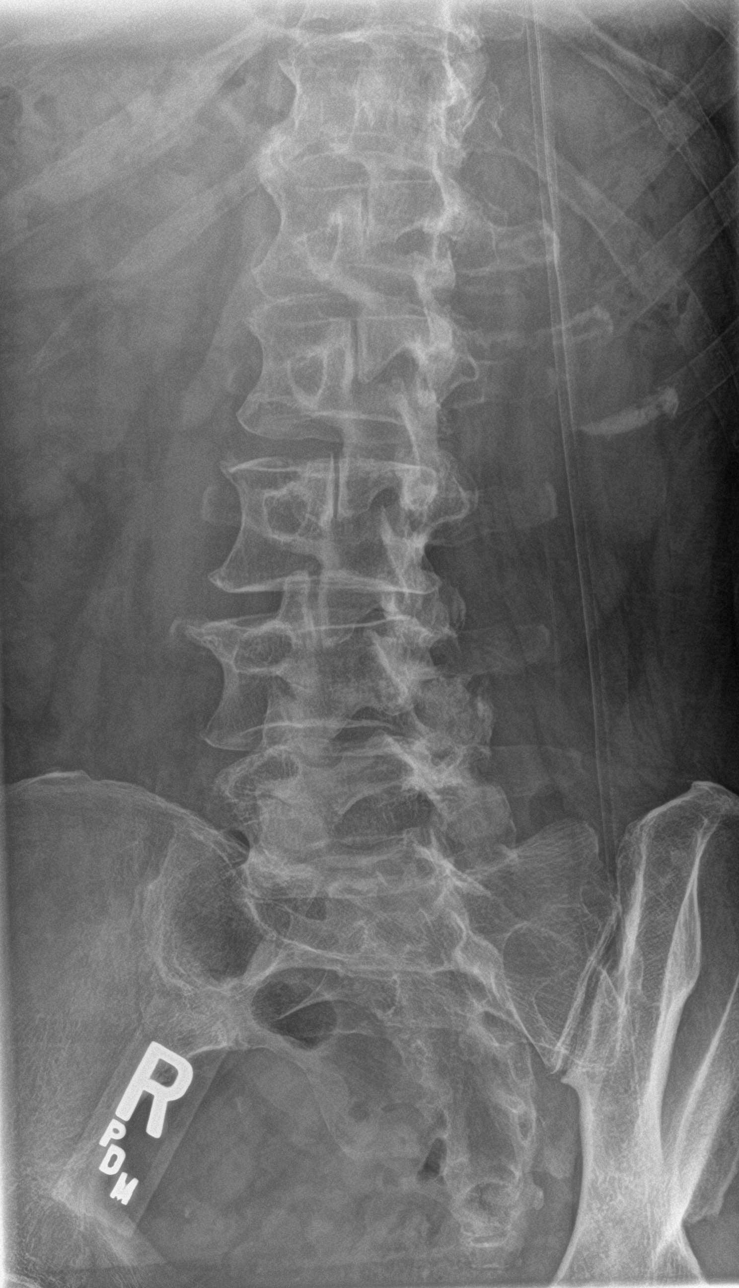

[l-spine obl (2 of 2)]
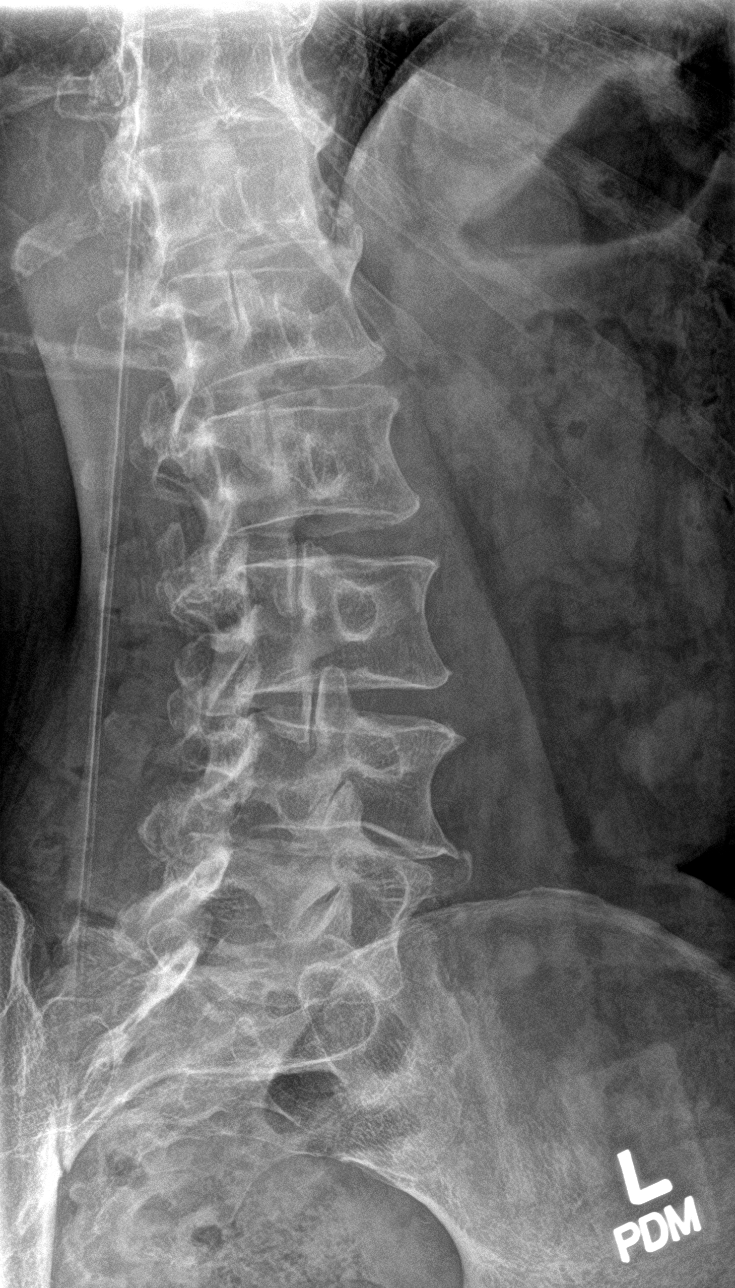

[l-spine lat]
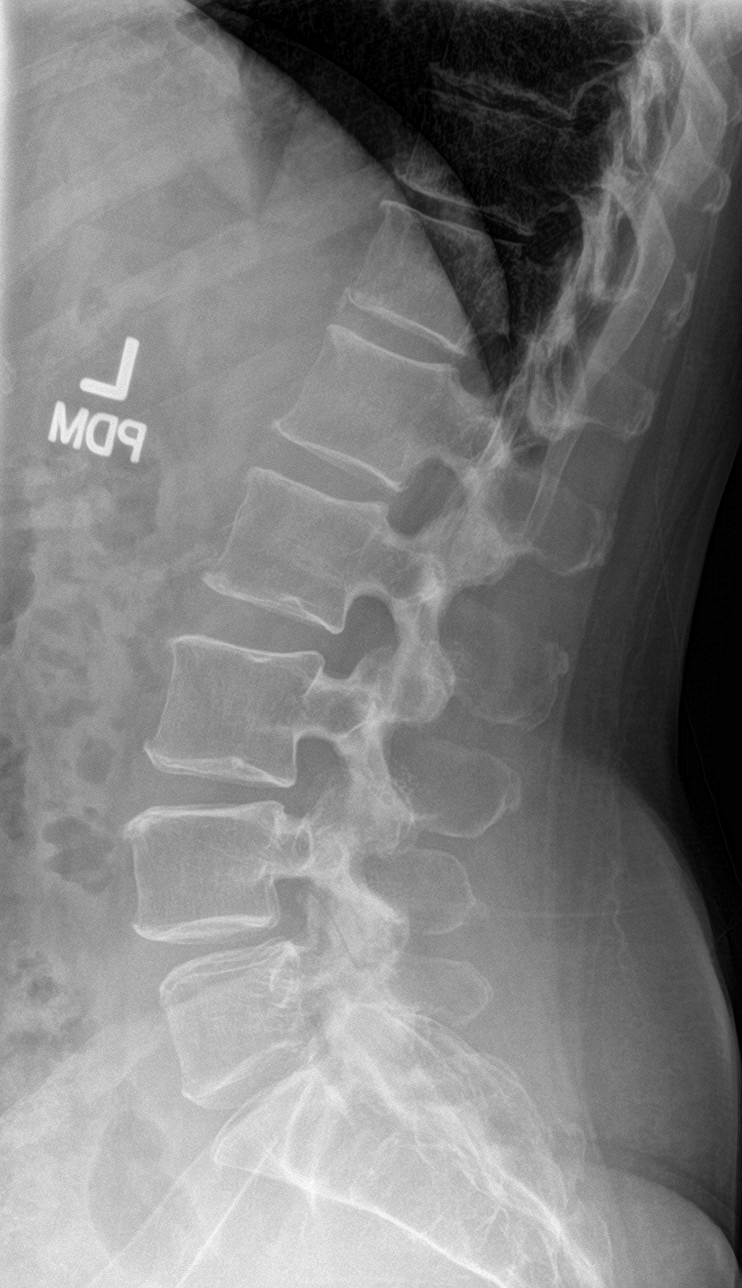

[l-spine spot]
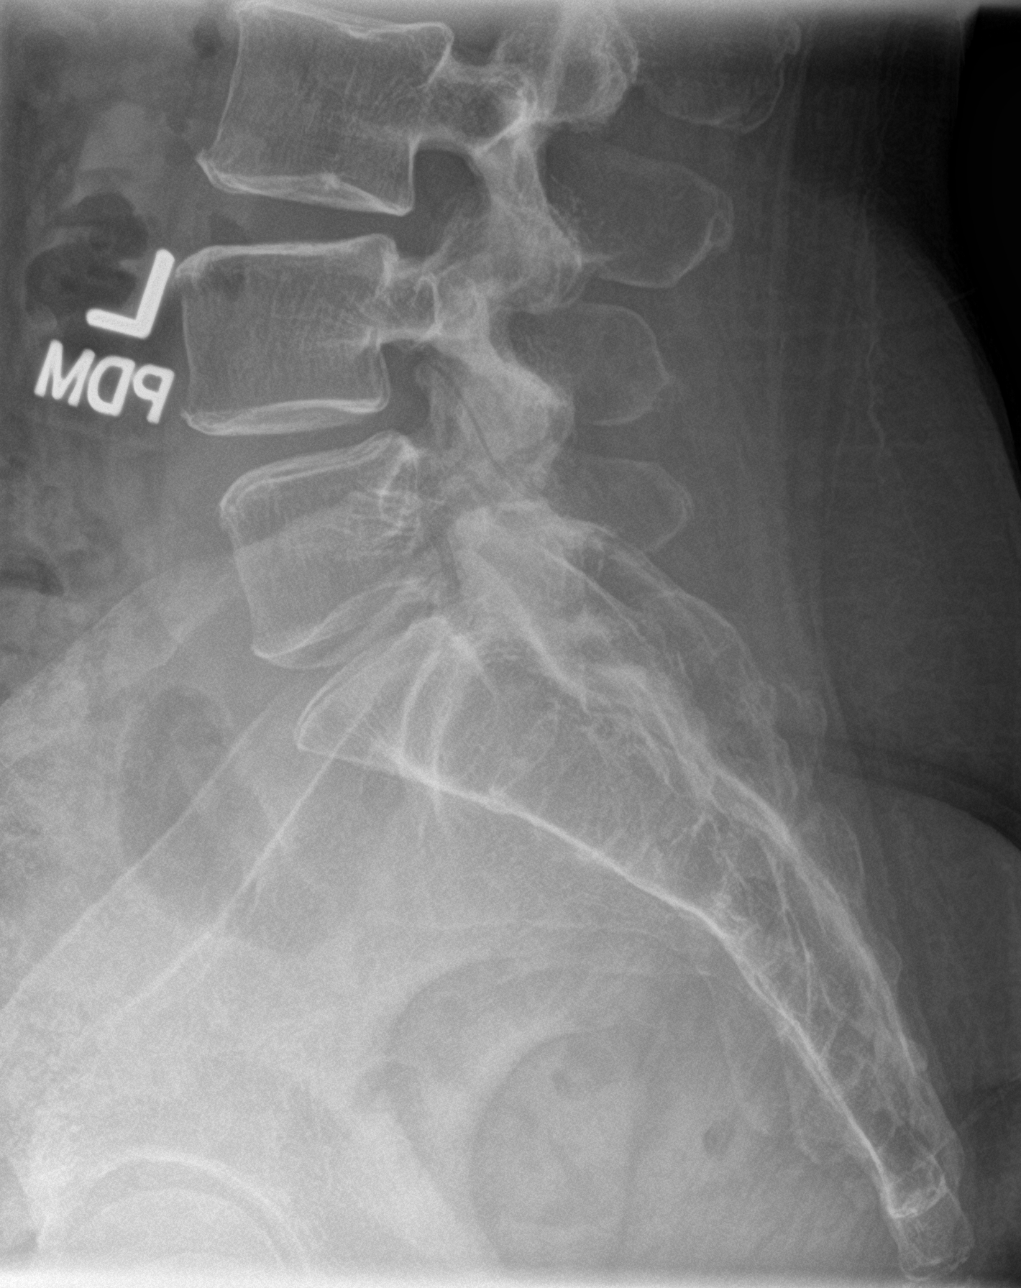

[5 of 5 positions shown; findings below may reference images not displayed]

FINDINGS: The lumbar vertebral bodies are preserved in height. There is mild
disc space narrowing at L4-5. There is slight anterolisthesis of L4
with respect L3 and L5 which is new. There is degenerative facet
joint change at L4-5 and at L5-S1. There is no significant
spondylolisthesis. The pedicles and transverse processes are intact.
The observed portions of the sacrum are normal.
IMPRESSION: Mild degenerative disc space narrowing at L4-5 with facet joint
hypertrophy at L4-5 and L5-S1. Mild anterolisthesis of L4 likely on
the basis of degenerative disc and facet joint change. No
compression fracture nor other significant bony abnormality.

Given the patient's radicular symptoms, lumbar spine MRI may be the
most useful next imaging step.

## 2018-05-17 ENCOUNTER — Other Ambulatory Visit: Payer: BLUE CROSS/BLUE SHIELD

## 2018-05-24 ENCOUNTER — Encounter: Payer: BLUE CROSS/BLUE SHIELD | Admitting: Adult Health

## 2018-11-24 DIAGNOSIS — L814 Other melanin hyperpigmentation: Secondary | ICD-10-CM | POA: Diagnosis not present

## 2018-11-24 DIAGNOSIS — L820 Inflamed seborrheic keratosis: Secondary | ICD-10-CM | POA: Diagnosis not present

## 2018-11-24 DIAGNOSIS — D225 Melanocytic nevi of trunk: Secondary | ICD-10-CM | POA: Diagnosis not present

## 2018-11-24 DIAGNOSIS — L853 Xerosis cutis: Secondary | ICD-10-CM | POA: Diagnosis not present

## 2018-11-24 DIAGNOSIS — L821 Other seborrheic keratosis: Secondary | ICD-10-CM | POA: Diagnosis not present

## 2019-05-18 ENCOUNTER — Ambulatory Visit (INDEPENDENT_AMBULATORY_CARE_PROVIDER_SITE_OTHER): Payer: BC Managed Care – PPO | Admitting: Internal Medicine

## 2019-05-18 DIAGNOSIS — Z7689 Persons encountering health services in other specified circumstances: Secondary | ICD-10-CM | POA: Diagnosis not present

## 2019-05-18 DIAGNOSIS — I10 Essential (primary) hypertension: Secondary | ICD-10-CM | POA: Diagnosis not present

## 2019-05-18 DIAGNOSIS — N952 Postmenopausal atrophic vaginitis: Secondary | ICD-10-CM | POA: Diagnosis not present

## 2019-05-18 MED ORDER — LISINOPRIL-HYDROCHLOROTHIAZIDE 10-12.5 MG PO TABS: 1.0000 | ORAL_TABLET | Freq: Every day | ORAL | 1 refills | Status: DC

## 2019-05-18 NOTE — Progress Notes (Signed)
Virtual Visit via Telephone Note  I connected with Lindsey Dyer, on 05/18/2019 at 8:54 AM by telephone due to the COVID-19 pandemic and verified that I am speaking with the correct person using two identifiers.   Consent: I discussed the limitations, risks, security and privacy concerns of performing an evaluation and management service by telephone and the availability of in person appointments. I also discussed with the patient that there may be a patient responsible charge related to this service. The patient expressed understanding and agreed to proceed.   Location of Patient: Home   Location of Provider: Clinic    Persons participating in Telemedicine visit: [facility] Dr. Juleen China      History of Present Illness: Patient has a visit to establish care.   Chronic HTN Disease Monitoring:  Home BP Monitoring - BP was 125/66 this AM. Patient reports this is consistent with what her BP normally runs.  Chest pain- no  Dyspnea- no Headache - no  Medications: Lisinopril-HCTZ 10-12.5 mg Compliance- Patient is taking about 3x week.  Lightheadedness- no  Edema- no        Past Medical History:  Diagnosis Date  . Hypertension    No Known Allergies  Current Outpatient Medications on File Prior to Visit  Medication Sig Dispense Refill  . conjugated estrogens (PREMARIN) vaginal cream Use 1/2 gram pv at hs twice weekly 30 g 1  . lisinopril-hydrochlorothiazide (PRINZIDE,ZESTORETIC) 10-12.5 MG tablet TAKE 1 TABLET BY MOUTH DAILY 90 tablet 0  . Multiple Vitamin (MULTIVITAMIN) tablet Take 1 tablet by mouth daily.    . Omega-3 Fatty Acids (FISH OIL PO) Take 1 capsule by mouth daily.      No current facility-administered medications on file prior to visit.    Observations/Objective: NAD. Speaking clearly.  Work of breathing normal.  Alert and oriented. Mood appropriate.   Assessment and Plan: 1. Encounter to establish care  2.  Hypertension, unspecified type BP at goal. Continue current therapy.  - lisinopril-hydrochlorothiazide (ZESTORETIC) 10-12.5 MG tablet; Take 1 tablet by mouth daily.  Dispense: 90 tablet; Refill: 1  3. 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 Has Rx for Premarin 2x weekly at nighttime. Will plan to do pelvic exam to assess integrity of vaginal tissue to help guide patient's decision about whether to continue medication.    Follow Up Instructions: In person for annual exam    I discussed the assessment and treatment plan with the patient. The patient was provided an opportunity to ask questions and all were answered. The patient agreed with the plan and demonstrated an understanding of the instructions.   The patient was advised to call back or seek an in-person evaluation if the symptoms worsen or if the condition fails to improve as anticipated.     I provided 18 minutes total of non-face-to-face time during this encounter including median intraservice time, reviewing previous notes, investigations, ordering medications, medical decision making, coordinating care and patient verbalized understanding at the end of the visit.    Phill Myron, D.O. Primary Care at [facility]  05/18/2019, 8:54 AM

## 2019-05-18 NOTE — Patient Instructions (Incomplete)
Thank you for choosing Primary Care at [facility] to be your medical home!    Lindsey Dyer was seen by Melina Schools, DO today.   Lindsey Dyer's primary care provider is Phill Myron, DO.   For the best care possible, you should try to see Phill Myron, DO whenever you come to the clinic.   We look forward to seeing you again soon!  If you have any questions about your visit today, please call us at [phone] or feel free to reach your primary care provider via [place].

## 2019-06-28 ENCOUNTER — Telehealth: Payer: Self-pay

## 2019-06-28 NOTE — Telephone Encounter (Signed)
Called patient to do their pre-visit COVID screening.  Have you tested positive for COVID or are you currently waiting for COVID test results? no  Have you recently traveled internationally([place], [place], [place], [place], [place]) or within the [place] to a hotspot area([place], [place], [place], [place], [place])? no  Are you currently experiencing any of the following symptoms: fever, cough, SHOB, fatigue, body aches, loss of smell/taste, rash, diarrhea, vomiting, severe headaches, weakness, sore throat? no  Have you been in contact with anyone who has recently travelled? no  Have you been in contact with anyone who is experiencing any of the above symptoms or been diagnosed with [place]  or works in or has recently visited a SNF? No  Asked patient to come fasting for CPE labs.

## 2019-06-28 NOTE — Patient Instructions (Signed)

## 2019-06-29 ENCOUNTER — Other Ambulatory Visit: Payer: Self-pay

## 2019-06-29 ENCOUNTER — Ambulatory Visit (INDEPENDENT_AMBULATORY_CARE_PROVIDER_SITE_OTHER): Payer: BC Managed Care – PPO | Admitting: Internal Medicine

## 2019-06-29 ENCOUNTER — Encounter: Payer: Self-pay | Admitting: Internal Medicine

## 2019-06-29 VITALS — BP 135/83 | HR 67 | Temp 97.3°F | Resp 17 | Ht 65.5 in | Wt 169.0 lb

## 2019-06-29 DIAGNOSIS — Z114 Encounter for screening for human immunodeficiency virus [HIV]: Secondary | ICD-10-CM

## 2019-06-29 DIAGNOSIS — R7989 Other specified abnormal findings of blood chemistry: Secondary | ICD-10-CM

## 2019-06-29 DIAGNOSIS — Z780 Asymptomatic menopausal state: Secondary | ICD-10-CM

## 2019-06-29 DIAGNOSIS — Z0000 Encounter for general adult medical examination without abnormal findings: Secondary | ICD-10-CM | POA: Diagnosis not present

## 2019-06-29 DIAGNOSIS — Z1231 Encounter for screening mammogram for malignant neoplasm of breast: Secondary | ICD-10-CM | POA: Diagnosis not present

## 2019-06-29 DIAGNOSIS — Z1159 Encounter for screening for other viral diseases: Secondary | ICD-10-CM | POA: Diagnosis not present

## 2019-06-29 DIAGNOSIS — Z13820 Encounter for screening for osteoporosis: Secondary | ICD-10-CM

## 2019-06-29 NOTE — Progress Notes (Signed)
  Subjective:    Lindsey Dyer - 64 y.o. female MRN [id number]  Date of birth: 12-28-1955  HPI  Lindsey Dyer is here for annual exam. She reports that she tries to eat correctly. She did boot camp for 5 years. She is now doing yoga 2x per week. She has no concerns today.   Health Maintenance:  Health Maintenance Due  Topic Date Due  . Hepatitis C Screening  Never done  . HIV Screening  Never done  . MAMMOGRAM  06/03/2019    -  reports that she has never smoked. She has never used smokeless tobacco. - Review of Systems: Per HPI. - Past Medical History: Patient Active Problem List   Diagnosis Date Noted  . Vaginal atrophy 11/23/2017  . Cervical lesion 05/21/2017  . Hypertension 04/16/2017  . Lumbar radiculopathy 12/28/2015   - Medications: reviewed and updated   Objective:   Physical Exam BP 135/83   Pulse 67   Temp (!) 97.3 F (36.3 C) (Temporal)   Resp 17   Ht 5' 5.5" (1.664 m)   Wt 169 lb (76.7 kg)   LMP 02/25/2007 (Within Years)   SpO2 97%   BMI 27.70 kg/m  Physical Exam  Constitutional: She is oriented to person, place, and time and well-developed, well-nourished, and in no distress.  HENT:  Head: Normocephalic and atraumatic.  Mouth/Throat: Oropharynx is clear and moist.  TMs normal bilaterally   Eyes: Pupils are equal, round, and reactive to light. Conjunctivae and EOM are normal.  Neck: No thyromegaly present.  Cardiovascular: Normal rate, regular rhythm, normal heart sounds and intact distal pulses.  No murmur heard. Pulmonary/Chest: Effort normal and breath sounds normal. No respiratory distress. She has no wheezes.  Abdominal: Soft. Bowel sounds are normal. She exhibits no distension. There is no abdominal tenderness. There is no rebound and no guarding.  Musculoskeletal:        General: No deformity or edema. Normal range of motion.     Cervical back: Normal range of motion and neck supple.  Lymphadenopathy:    She has no cervical adenopathy.   Neurological: She is alert and oriented to person, place, and time. Gait normal.  Skin: Skin is warm and dry. No rash noted. She is not diaphoretic.  Psychiatric: Mood, affect and judgment normal.       Assessment & Plan:   1. Annual physical exam Counseled on 150 minutes of exercise per week, healthy eating (including decreased daily intake of saturated fats, cholesterol, added sugars, sodium), STI prevention, routine healthcare maintenance. - CBC with Differential - Comprehensive metabolic panel - Lipid Panel  2. Breast cancer screening by mammogram - MM Digital Screening; Future  3. Need for hepatitis C screening test - Hepatitis C Antibody  4. Screening for HIV (human immunodeficiency virus) - HIV antibody (with reflex)  5. Abnormal thyroid blood test TSH is Feb 2019 was slightly high at 5.070 and previously low at 0.33 in 2017. No h/o taking medications or diagnosis of thyroid disorder. Will repeat studies today.  - Thyroid Panel With TSH    Phill Myron, D.O. 06/29/2019, 8:57 AM Primary Care at [facility]

## 2019-06-30 LAB — CBC WITH DIFFERENTIAL/PLATELET
Basophils Absolute: 0 10*3/uL (ref 0.0–0.2)
Basos: 1 %
EOS (ABSOLUTE): 0.1 10*3/uL (ref 0.0–0.4)
Eos: 3 %
Hematocrit: 44.2 % (ref 34.0–46.6)
Hemoglobin: 14.7 g/dL (ref 11.1–15.9)
Immature Grans (Abs): 0 10*3/uL (ref 0.0–0.1)
Immature Granulocytes: 0 %
Lymphocytes Absolute: 1.5 10*3/uL (ref 0.7–3.1)
Lymphs: 35 %
MCH: 30.4 pg (ref 26.6–33.0)
MCHC: 33.3 g/dL (ref 31.5–35.7)
MCV: 91 fL (ref 79–97)
Monocytes Absolute: 0.4 10*3/uL (ref 0.1–0.9)
Monocytes: 8 %
Neutrophils Absolute: 2.3 10*3/uL (ref 1.4–7.0)
Neutrophils: 53 %
Platelets: 237 10*3/uL (ref 150–450)
RBC: 4.84 x10E6/uL (ref 3.77–5.28)
RDW: 11.8 % (ref 11.7–15.4)
WBC: 4.4 10*3/uL (ref 3.4–10.8)

## 2019-06-30 LAB — THYROID PANEL WITH TSH
Free Thyroxine Index: 1.6 (ref 1.2–4.9)
T3 Uptake Ratio: 25 % (ref 24–39)
T4, Total: 6.4 ug/dL (ref 4.5–12.0)
TSH: 3.94 u[IU]/mL (ref 0.450–4.500)

## 2019-06-30 LAB — COMPREHENSIVE METABOLIC PANEL
ALT: 14 IU/L (ref 0–32)
AST: 23 IU/L (ref 0–40)
Albumin/Globulin Ratio: 1.8 (ref 1.2–2.2)
Albumin: 4.6 g/dL (ref 3.8–4.8)
Alkaline Phosphatase: 87 IU/L (ref 39–117)
BUN/Creatinine Ratio: 11 — ABNORMAL LOW (ref 12–28)
BUN: 10 mg/dL (ref 8–27)
Bilirubin Total: 0.3 mg/dL (ref 0.0–1.2)
CO2: 19 mmol/L — ABNORMAL LOW (ref 20–29)
Calcium: 9.4 mg/dL (ref 8.7–10.3)
Chloride: 102 mmol/L (ref 96–106)
Creatinine, Ser: 0.88 mg/dL (ref 0.57–1.00)
GFR calc Af Amer: 81 mL/min/{1.73_m2} (ref >59)
GFR calc non Af Amer: 70 mL/min/{1.73_m2} (ref >59)
Globulin, Total: 2.5 g/dL (ref 1.5–4.5)
Glucose: 86 mg/dL (ref 65–99)
Potassium: 4.4 mmol/L (ref 3.5–5.2)
Sodium: 138 mmol/L (ref 134–144)
Total Protein: 7.1 g/dL (ref 6.0–8.5)

## 2019-06-30 LAB — LIPID PANEL
Chol/HDL Ratio: 3.9 ratio (ref 0.0–4.4)
Cholesterol, Total: 227 mg/dL — ABNORMAL HIGH (ref 100–199)
HDL: 58 mg/dL (ref >39)
LDL Chol Calc (NIH): 154 mg/dL — ABNORMAL HIGH (ref 0–99)
Triglycerides: 88 mg/dL (ref 0–149)
VLDL Cholesterol Cal: 15 mg/dL (ref 5–40)

## 2019-06-30 LAB — HIV ANTIBODY (ROUTINE TESTING W REFLEX): HIV Screen 4th Generation wRfx: NONREACTIVE

## 2019-06-30 LAB — HEPATITIS C ANTIBODY: Hep C Virus Ab: <0.1 s/co ratio (ref 0.0–0.9)

## 2019-07-01 NOTE — Progress Notes (Signed)
Patient notified of results & recommendations. Expressed understanding. Declines starting statin medication at this time.

## 2019-07-27 ENCOUNTER — Ambulatory Visit: Payer: BC Managed Care – PPO

## 2019-09-29 ENCOUNTER — Ambulatory Visit
Admission: RE | Admit: 2019-09-29 | Discharge: 2019-09-29 | Disposition: A | Discharge status: Home / Self Care | Payer: BC Managed Care – PPO | Source: Ambulatory Visit | Attending: Internal Medicine | Admitting: Internal Medicine

## 2019-09-29 ENCOUNTER — Other Ambulatory Visit: Payer: Self-pay

## 2019-09-29 DIAGNOSIS — Z1231 Encounter for screening mammogram for malignant neoplasm of breast: Secondary | ICD-10-CM | POA: Diagnosis not present

## 2019-11-21 DIAGNOSIS — L821 Other seborrheic keratosis: Secondary | ICD-10-CM | POA: Diagnosis not present

## 2019-11-21 DIAGNOSIS — D485 Neoplasm of uncertain behavior of skin: Secondary | ICD-10-CM | POA: Diagnosis not present

## 2019-11-21 DIAGNOSIS — D225 Melanocytic nevi of trunk: Secondary | ICD-10-CM | POA: Diagnosis not present

## 2019-11-21 DIAGNOSIS — L814 Other melanin hyperpigmentation: Secondary | ICD-10-CM | POA: Diagnosis not present

## 2019-11-21 DIAGNOSIS — D1801 Hemangioma of skin and subcutaneous tissue: Secondary | ICD-10-CM | POA: Diagnosis not present

## 2019-11-21 DIAGNOSIS — B353 Tinea pedis: Secondary | ICD-10-CM | POA: Diagnosis not present

## 2019-11-30 ENCOUNTER — Telehealth: Payer: BC Managed Care – PPO | Admitting: Internal Medicine

## 2019-12-13 ENCOUNTER — Telehealth (INDEPENDENT_AMBULATORY_CARE_PROVIDER_SITE_OTHER): Payer: BC Managed Care – PPO | Admitting: Internal Medicine

## 2019-12-13 ENCOUNTER — Encounter: Payer: Self-pay | Admitting: Internal Medicine

## 2019-12-13 DIAGNOSIS — I10 Essential (primary) hypertension: Secondary | ICD-10-CM | POA: Diagnosis not present

## 2019-12-13 MED ORDER — LISINOPRIL-HYDROCHLOROTHIAZIDE 10-12.5 MG PO TABS: 1.0000 | ORAL_TABLET | Freq: Every day | ORAL | 1 refills | Status: DC

## 2019-12-13 NOTE — Progress Notes (Signed)
Virtual Visit via Telephone Note  I connected with Lindsey Dyer, on 12/13/2019 at 2:35 PM by telephone due to the COVID-19 pandemic and verified that I am speaking with the correct person using two identifiers.   Consent: I discussed the limitations, risks, security and privacy concerns of performing an evaluation and management service by telephone and the availability of in person appointments. I also discussed with the patient that there may be a patient responsible charge related to this service. The patient expressed understanding and agreed to proceed.   Location of Patient: Home   Location of Provider: Clinic    Persons participating in Telemedicine visit: [facility] [place] [facility] Dr. Juleen China      History of Present Illness: Patient has a visit to follow up on HTN.   Chronic HTN Disease Monitoring:  Home BP Monitoring - 135-140/80-85  Chest pain- no  Dyspnea- no Headache - no  Medications: Lisinopril 10 mg, HCTZ 12.5 mg  Compliance- yes Lightheadedness- no  Edema- no      Past Medical History:  Diagnosis Date  . Hypertension    No Known Allergies  Current Outpatient Medications on File Prior to Visit  Medication Sig Dispense Refill  . conjugated estrogens (PREMARIN) vaginal cream Use 1/2 gram pv at hs twice weekly 30 g 1  . ketoconazole (NIZORAL) 2 % cream Apply 1 application topically 2 (two) times daily.    Marland Kitchen lisinopril-hydrochlorothiazide (ZESTORETIC) 10-12.5 MG tablet Take 1 tablet by mouth daily. 90 tablet 1  . Multiple Vitamin (MULTIVITAMIN) tablet Take 1 tablet by mouth daily.    . Omega-3 Fatty Acids (FISH OIL PO) Take 1 capsule by mouth daily.      No current facility-administered medications on file prior to visit.    Observations/Objective: NAD. Speaking clearly.  Work of breathing normal.  Alert and oriented. Mood appropriate.   Assessment and Plan: 1. Hypertension, unspecified type BP seems well controlled.  Asymptomatic. Continue current regimen.  - lisinopril-hydrochlorothiazide (ZESTORETIC) 10-12.5 MG tablet; Take 1 tablet by mouth daily.  Dispense: 90 tablet; Refill: 1   Follow Up Instructions: 6 month f/u HTN and annual exam    I discussed the assessment and treatment plan with the patient. The patient was provided an opportunity to ask questions and all were answered. The patient agreed with the plan and demonstrated an understanding of the instructions.   The patient was advised to call back or seek an in-person evaluation if the symptoms worsen or if the condition fails to improve as anticipated.     I provided 14 minutes total of non-face-to-face time during this encounter including median intraservice time, reviewing previous notes, investigations, ordering medications, medical decision making, coordinating care and patient verbalized understanding at the end of the visit.    Phill Myron, D.O. Primary Care at [facility]  12/13/2019, 2:35 PM

## 2020-07-17 ENCOUNTER — Ambulatory Visit (INDEPENDENT_AMBULATORY_CARE_PROVIDER_SITE_OTHER): Payer: BC Managed Care – PPO | Admitting: Internal Medicine

## 2020-07-17 ENCOUNTER — Encounter: Payer: Self-pay | Admitting: Internal Medicine

## 2020-07-17 ENCOUNTER — Other Ambulatory Visit: Payer: Self-pay

## 2020-07-17 ENCOUNTER — Other Ambulatory Visit (HOSPITAL_COMMUNITY)
Admission: RE | Admit: 2020-07-17 | Discharge: 2020-07-17 | Disposition: A | Discharge status: Home / Self Care | Payer: BC Managed Care – PPO | Source: Ambulatory Visit | Attending: Internal Medicine | Admitting: Internal Medicine

## 2020-07-17 VITALS — BP 116/77 | HR 62 | Temp 97.3°F | Resp 16 | Ht 64.5 in | Wt 159.0 lb

## 2020-07-17 DIAGNOSIS — Z13228 Encounter for screening for other metabolic disorders: Secondary | ICD-10-CM

## 2020-07-17 DIAGNOSIS — Z130 Encounter for screening for diseases of the blood and blood-forming organs and certain disorders involving the immune mechanism: Secondary | ICD-10-CM | POA: Diagnosis not present

## 2020-07-17 DIAGNOSIS — Z124 Encounter for screening for malignant neoplasm of cervix: Secondary | ICD-10-CM

## 2020-07-17 DIAGNOSIS — Z0000 Encounter for general adult medical examination without abnormal findings: Secondary | ICD-10-CM

## 2020-07-17 DIAGNOSIS — I10 Essential (primary) hypertension: Secondary | ICD-10-CM

## 2020-07-17 DIAGNOSIS — N952 Postmenopausal atrophic vaginitis: Secondary | ICD-10-CM

## 2020-07-17 NOTE — Progress Notes (Signed)
PAP Discuss estrogen cream and BP medication

## 2020-07-17 NOTE — Progress Notes (Signed)
Subjective:    Lindsey Dyer - 65 y.o. female MRN [id number]  Date of birth: 1955-11-18  HPI  Lindsey Dyer is here for annual exam.  Depression screen [facility] 2/9 07/17/2020 05/18/2019 11/23/2017  Decreased Interest 0 0 0  Down, Depressed, Hopeless 0 0 0  PHQ - 2 Score 0 0 0  Altered sleeping 0 - 0  Tired, decreased energy 0 - 0  Change in appetite 0 - 0  Feeling bad or failure about yourself  0 - 0  Trouble concentrating 0 - 0  Moving slowly or fidgety/restless 0 - 0  Suicidal thoughts 0 - 0  PHQ-9 Score 0 - 0  Difficult doing work/chores - - -     Health Maintenance:  Health Maintenance Due  Topic Date Due  . COVID-19 Vaccine (1) Never done  . PAP SMEAR-Modifier  05/21/2020    -  reports that she has never smoked. She has never used smokeless tobacco. - Review of Systems: Per HPI. - Past Medical History: Patient Active Problem List   Diagnosis Date Noted  . Vaginal atrophy 11/23/2017  . Cervical lesion 05/21/2017  . Hypertension 04/16/2017  . Lumbar radiculopathy 12/28/2015   - Medications: reviewed and updated   Objective:   Physical Exam BP 116/77   Pulse 62   Temp (!) 97.3 F (36.3 C)   Resp 16   Ht 5' 4.5" (1.638 m)   Wt 159 lb (72.1 kg)   LMP 02/25/2007 (Within Years)   SpO2 98%   BMI 26.87 kg/m  Physical Exam Constitutional:      Appearance: She is not diaphoretic.  HENT:     Head: Normocephalic and atraumatic.  Eyes:     Conjunctiva/sclera: Conjunctivae normal.     Pupils: Pupils are equal, round, and reactive to light.  Neck:     Thyroid: No thyromegaly.  Cardiovascular:     Rate and Rhythm: Normal rate and regular rhythm.     Heart sounds: Normal heart sounds. No murmur heard.   Pulmonary:     Effort: Pulmonary effort is normal. No respiratory distress.     Breath sounds: Normal breath sounds. No wheezing.  Abdominal:     General: Bowel sounds are normal. There is no distension.     Palpations: Abdomen is soft.     Tenderness: There  is no abdominal tenderness. There is no guarding or rebound.  Genitourinary:    Comments: GU/GYN: Exam performed in the presence of a chaperone. External genitalia within normal limits.  Vaginal mucosa pink, moist, some loss of rugae consistent with age.  Nonfriable cervix without lesions, no discharge or bleeding noted on speculum exam.  Bimanual exam revealed normal, nongravid uterus.  No cervical motion tenderness. No adnexal masses bilaterally.    Musculoskeletal:        General: No deformity. Normal range of motion.     Cervical back: Normal range of motion and neck supple.  Lymphadenopathy:     Cervical: No cervical adenopathy.  Skin:    General: Skin is warm and dry.     Findings: No rash.  Neurological:     Mental Status: She is alert and oriented to person, place, and time.     Gait: Gait is intact.  Psychiatric:        Mood and Affect: Mood and affect normal.        Judgment: Judgment normal.            Assessment & Plan:  1. Encounter for  annual physical exam Counseled on 150 minutes of exercise per week, healthy eating (including decreased daily intake of saturated fats, cholesterol, added sugars, sodium), STI prevention, routine healthcare maintenance.  2. Hypertension, unspecified type BP very well controlled. She is monitoring at home occasionally without concern. Was on very low dose Lisinopril 10 mg and HCTZ 12.5. Will trial off of medications. Parameters given for home monitoring. If >140/90, will plan to restart one medication and monitor.  - Comprehensive metabolic panel - Lipid panel  3. Screening for metabolic disorder - Comprehensive metabolic panel - Lipid panel  4. Screening for deficiency anemia - CBC  5. Screening for cervical cancer - Cytology - PAP([place])  6. Vaginal atrophy Has been off estrogen vaginal cream for about 3 months. Vaginal tissue appears very healthy for age and patient is asymptomatic. Discontinue.     Phill Myron, D.O. 07/17/2020, 8:57 AM Primary Care at [facility]

## 2020-07-18 LAB — COMPREHENSIVE METABOLIC PANEL
ALT: 23 IU/L (ref 0–32)
AST: 22 IU/L (ref 0–40)
Albumin/Globulin Ratio: 1.6 (ref 1.2–2.2)
Albumin: 4.1 g/dL (ref 3.8–4.8)
Alkaline Phosphatase: 65 IU/L (ref 44–121)
BUN/Creatinine Ratio: 7 — ABNORMAL LOW (ref 12–28)
BUN: 6 mg/dL — ABNORMAL LOW (ref 8–27)
Bilirubin Total: 0.5 mg/dL (ref 0.0–1.2)
CO2: 22 mmol/L (ref 20–29)
Calcium: 8.8 mg/dL (ref 8.7–10.3)
Chloride: 106 mmol/L (ref 96–106)
Creatinine, Ser: 0.81 mg/dL (ref 0.57–1.00)
Globulin, Total: 2.6 g/dL (ref 1.5–4.5)
Glucose: 81 mg/dL (ref 65–99)
Potassium: 3.8 mmol/L (ref 3.5–5.2)
Sodium: 143 mmol/L (ref 134–144)
Total Protein: 6.7 g/dL (ref 6.0–8.5)
eGFR: 81 mL/min/{1.73_m2} (ref >59)

## 2020-07-18 LAB — LIPID PANEL
Chol/HDL Ratio: 4.6 ratio — ABNORMAL HIGH (ref 0.0–4.4)
Cholesterol, Total: 165 mg/dL (ref 100–199)
HDL: 36 mg/dL — ABNORMAL LOW (ref >39)
LDL Chol Calc (NIH): 105 mg/dL — ABNORMAL HIGH (ref 0–99)
Triglycerides: 135 mg/dL (ref 0–149)
VLDL Cholesterol Cal: 24 mg/dL (ref 5–40)

## 2020-07-18 LAB — CBC
Hematocrit: 43.9 % (ref 34.0–46.6)
Hemoglobin: 14.3 g/dL (ref 11.1–15.9)
MCH: 30.2 pg (ref 26.6–33.0)
MCHC: 32.6 g/dL (ref 31.5–35.7)
MCV: 93 fL (ref 79–97)
Platelets: 219 10*3/uL (ref 150–450)
RBC: 4.73 x10E6/uL (ref 3.77–5.28)
RDW: 12.3 % (ref 11.7–15.4)
WBC: 4.5 10*3/uL (ref 3.4–10.8)

## 2020-07-18 LAB — CYTOLOGY - PAP
Comment: NEGATIVE
Diagnosis: NEGATIVE
High risk HPV: NEGATIVE

## 2020-12-26 ENCOUNTER — Encounter: Payer: Self-pay | Admitting: Gastroenterology

## 2021-01-21 ENCOUNTER — Ambulatory Visit: Payer: BC Managed Care – PPO | Admitting: Family

## 2021-02-07 ENCOUNTER — Encounter: Payer: Self-pay | Admitting: Gastroenterology

## 2021-02-07 ENCOUNTER — Other Ambulatory Visit: Payer: Self-pay

## 2021-02-07 ENCOUNTER — Ambulatory Visit (AMBULATORY_SURGERY_CENTER): Payer: Self-pay

## 2021-02-07 VITALS — Ht 65.0 in | Wt 150.0 lb

## 2021-02-07 DIAGNOSIS — Z86010 Personal history of colonic polyps: Secondary | ICD-10-CM

## 2021-02-07 MED ORDER — PEG 3350-KCL-NA BICARB-NACL 420 G PO SOLR: 4000.0000 mL | Freq: Once | ORAL | 0 refills | Status: AC

## 2021-02-07 NOTE — Progress Notes (Signed)
Denies allergies to eggs or soy products. Denies complication of anesthesia or sedation. Denies use of weight loss medication. Denies use of O2.   Emmi instructions given for colonoscopy.  

## 2021-02-15 ENCOUNTER — Ambulatory Visit (AMBULATORY_SURGERY_CENTER): Payer: PPO | Admitting: Gastroenterology

## 2021-02-15 ENCOUNTER — Encounter: Payer: Self-pay | Admitting: Gastroenterology

## 2021-02-15 VITALS — BP 127/75 | HR 66 | Temp 97.8°F | Resp 14 | Ht 65.0 in | Wt 150.0 lb

## 2021-02-15 DIAGNOSIS — D122 Benign neoplasm of ascending colon: Secondary | ICD-10-CM

## 2021-02-15 DIAGNOSIS — K635 Polyp of colon: Secondary | ICD-10-CM

## 2021-02-15 DIAGNOSIS — Z1211 Encounter for screening for malignant neoplasm of colon: Secondary | ICD-10-CM

## 2021-02-15 DIAGNOSIS — Z86010 Personal history of colonic polyps: Secondary | ICD-10-CM

## 2021-02-15 DIAGNOSIS — I10 Essential (primary) hypertension: Secondary | ICD-10-CM | POA: Diagnosis not present

## 2021-02-15 DIAGNOSIS — D120 Benign neoplasm of cecum: Secondary | ICD-10-CM

## 2021-02-15 MED ORDER — SODIUM CHLORIDE 0.9 % IV SOLN: 500.0000 mL | Freq: Once | INTRAVENOUS | Status: DC

## 2021-02-15 NOTE — Patient Instructions (Signed)
Handout given for polyps.  YOU HAD AN ENDOSCOPIC PROCEDURE TODAY AT [facility]:   Refer to the procedure report that was given to you for any specific questions about what was found during the examination.  If the procedure report does not answer your questions, please call your gastroenterologist to clarify.  If you requested that your care partner not be given the details of your procedure findings, then the procedure report has been included in a sealed envelope for you to review at your convenience later.  YOU SHOULD EXPECT: Some feelings of bloating in the abdomen. Passage of more gas than usual.  Walking can help get rid of the air that was put into your GI tract during the procedure and reduce the bloating. If you had a lower endoscopy (such as a colonoscopy or flexible sigmoidoscopy) you may notice spotting of blood in your stool or on the toilet paper. If you underwent a bowel prep for your procedure, you may not have a normal bowel movement for a few days.  Please Note:  You might notice some irritation and congestion in your nose or some drainage.  This is from the oxygen used during your procedure.  There is no need for concern and it should clear up in a day or so.  SYMPTOMS TO REPORT IMMEDIATELY:  Following lower endoscopy (colonoscopy):  Excessive amounts of blood in the stool  Significant tenderness or worsening of abdominal pains  Swelling of the abdomen that is new, acute  Fever of 100F or higher  For urgent or emergent issues, a gastroenterologist can be reached at any hour by calling [phone]. Do not use MyChart messaging for urgent concerns.    DIET:  We do recommend a small meal at first, but then you may proceed to your regular diet.  Drink plenty of fluids but you should avoid alcoholic beverages for 24 hours.  ACTIVITY:  You should plan to take it easy for the rest of today and you should NOT DRIVE or use heavy machinery until tomorrow (because  of the sedation medicines used during the test).    FOLLOW UP: Our staff will call the number listed on your records 48-72 hours following your procedure to check on you and address any questions or concerns that you may have regarding the information given to you following your procedure. If we do not reach you, we will leave a message.  We will attempt to reach you two times.  During this call, we will ask if you have developed any symptoms of COVID 19. If you develop any symptoms (ie: fever, flu-like symptoms, shortness of breath, cough etc.) before then, please call [phone].  If you test positive for Covid 19 in the 2 weeks post procedure, please call and report this information to [place].    If any biopsies were taken you will be contacted by phone or by letter within the next 1-3 weeks.  Please call us at [phone] if you have not heard about the biopsies in 3 weeks.    SIGNATURES/CONFIDENTIALITY: You and/or your care partner have signed paperwork which will be entered into your electronic medical record.  These signatures attest to the fact that that the information above on your After Visit Summary has been reviewed and is understood.  Full responsibility of the confidentiality of this discharge information lies with you and/or your care-partner.

## 2021-02-15 NOTE — Progress Notes (Signed)
VS-CW  Pt's states no medical or surgical changes since previsit or office visit.  

## 2021-02-15 NOTE — Progress Notes (Signed)
Called to room to assist during endoscopic procedure.  Patient ID and intended procedure confirmed with present staff. Received instructions for my participation in the procedure from the performing physician.  

## 2021-02-15 NOTE — Progress Notes (Signed)
[facility] History and Physical   Primary Care Physician:  Nicolette Bang, MD   Reason for Procedure:   History of colon polyps  Plan:    colonoscopy     HPI: Lindsey Dyer is a 65 y.o. female  here for colonoscopy surveillance - 4 polyps - SSPs, TA, removed 11/2017. Patient denies any bowel symptoms at this time. No family history of colon cancer known. Otherwise feels well without any cardiopulmonary symptoms.    Past Medical History:  Diagnosis Date   Hypertension     Past Surgical History:  Procedure Laterality Date   COLONOSCOPY  09/21/2007   Deatra Ina    TONSILLECTOMY      Prior to Admission medications   Medication Sig Start Date End Date Taking? Authorizing Provider  Multiple Vitamin (MULTIVITAMIN) tablet Take 1 tablet by mouth daily.   Yes [provider]  Omega-3 Fatty Acids (FISH OIL PO) Take 1 capsule by mouth daily.    Yes [provider]    Current Outpatient Medications  Medication Sig Dispense Refill   Multiple Vitamin (MULTIVITAMIN) tablet Take 1 tablet by mouth daily.     Omega-3 Fatty Acids (FISH OIL PO) Take 1 capsule by mouth daily.      Current Facility-Administered Medications  Medication Dose Route Frequency Provider Last Rate Last Admin   0.9 %  sodium chloride infusion  500 mL Intravenous Once Mckensi Redinger, Carlota Raspberry, MD        Allergies as of 02/15/2021   (No Known Allergies)    Family History  Problem Relation Age of Onset   Hypertension Mother    Stroke Father    Healthy Brother    Healthy Daughter    Healthy Son    Stroke Paternal Aunt    Healthy Brother    Cancer Brother        lung   Cancer Brother        prostate   Colon cancer Neg Hx    Colon polyps Neg Hx    Rectal cancer Neg Hx    Stomach cancer Neg Hx    Esophageal cancer Neg Hx     Social History   Socioeconomic History   Marital status: Married    Spouse name: Not on file   Number of children: Not on file   Years of  education: Not on file   Highest education level: Not on file  Occupational History   Not on file  Tobacco Use   Smoking status: Never   Smokeless tobacco: Never  Vaping Use   Vaping Use: Never used  Substance and Sexual Activity   Alcohol use: No   Drug use: No   Sexual activity: Yes    Birth control/protection: None, Post-menopausal  Other Topics Concern   Not on file  Social History Narrative   Not on file   Social Determinants of Health   Financial Resource Strain: Not on file  Food Insecurity: Not on file  Transportation Needs: Not on file  Physical Activity: Not on file  Stress: Not on file  Social Connections: Not on file  Intimate Partner Violence: Not on file    Review of Systems: All other review of systems negative except as mentioned in the HPI.  Physical Exam: Vital signs BP 136/73    Pulse 72    Temp 97.8 F (36.6 C)    Ht 5\' 5"  (1.651 m)    Wt 150 lb (68 kg)    LMP 02/25/2007 (Within Years)  SpO2 100%    BMI 24.96 kg/m   General:   Alert,  Well-developed, pleasant and cooperative in NAD Lungs:  Clear throughout to auscultation.   Heart:  Regular rate and rhythm Abdomen:  Soft, nontender and nondistended.   Neuro/Psych:  Alert and cooperative. Normal mood and affect. A and O x 3  Jolly Mango, MD [facility]

## 2021-02-15 NOTE — Progress Notes (Signed)
Sedate, gd SR, tolerated procedure well, VSS, report to RN 

## 2021-02-15 NOTE — Op Note (Signed)
[facility] Patient Name: Lindsey Dyer Procedure Date: 02/15/2021 1:43 PM MRN: [id number] Endoscopist: Remo Lipps P. Havery Moros , MD Age: 65 Referring MD:  Date of Birth: 1955/07/17 Gender: Female Account #: [id number] Procedure:                Colonoscopy Indications:              High risk colon cancer surveillance: Personal                            history of colonic polyps - 11/2017 - 4 polyps                            (SSPs, TA) Medicines:                Monitored Anesthesia Care Procedure:                Pre-Anesthesia Assessment:                           - Prior to the procedure, a History and Physical                            was performed, and patient medications and                            allergies were reviewed. The patient's tolerance of                            previous anesthesia was also reviewed. The risks                            and benefits of the procedure and the sedation                            options and risks were discussed with the patient.                            All questions were answered, and informed consent                            was obtained. Prior Anticoagulants: The patient has                            taken no previous anticoagulant or antiplatelet                            agents. ASA Grade Assessment: II - A patient with                            mild systemic disease. After reviewing the risks                            and benefits, the patient was deemed in  satisfactory condition to undergo the procedure.                           After obtaining informed consent, the colonoscope                            was passed under direct vision. Throughout the                            procedure, the patient's blood pressure, pulse, and                            oxygen saturations were monitored continuously. The                            Olympus PCF-H190DL (#[id number]) Colonoscope was                             introduced through the anus and advanced to the the                            cecum, identified by appendiceal orifice and                            ileocecal valve. The colonoscopy was performed                            without difficulty. The patient tolerated the                            procedure well. The quality of the bowel                            preparation was good. The ileocecal valve,                            appendiceal orifice, and rectum were photographed. Scope In: 1:48:22 PM Scope Out: 2:12:35 PM Scope Withdrawal Time: 0 hours 18 minutes 55 seconds  Total Procedure Duration: 0 hours 24 minutes 13 seconds  Findings:                 The perianal and digital rectal examinations were                            normal.                           A 3 mm polyp was found in the cecum. The polyp was                            flat. The polyp was removed with a cold snare.                            Resection and retrieval were complete.  A 3 mm polyp was found in the ascending colon. The                            polyp was flat. The polyp was removed with a cold                            snare. Resection and retrieval were complete.                           The colon was tortuous.                           Internal hemorrhoids were found during retroflexion.                           The exam was otherwise without abnormality. Complications:            No immediate complications. Estimated blood loss:                            Minimal. Estimated Blood Loss:     Estimated blood loss was minimal. Impression:               - One 3 mm polyp in the cecum, removed with a cold                            snare. Resected and retrieved.                           - One 3 mm polyp in the ascending colon, removed                            with a cold snare. Resected and retrieved.                           - Tortuous colon.                            - Internal hemorrhoids.                           - The examination was otherwise normal. Recommendation:           - Patient has a contact number available for                            emergencies. The signs and symptoms of potential                            delayed complications were discussed with the                            patient. Return to normal activities tomorrow.                            Written discharge  instructions were provided to the                            patient.                           - Resume previous diet.                           - Continue present medications.                           - Await pathology results. Remo Lipps P. Shulem Mader, MD 02/15/2021 2:16:28 PM This report has been signed electronically.

## 2021-02-20 ENCOUNTER — Telehealth: Payer: Self-pay | Admitting: *Deleted

## 2021-02-20 NOTE — Telephone Encounter (Signed)
°  Follow up Call-  Call back number 02/15/2021  Post procedure Call Back phone  # [phone]  Permission to leave phone message Yes  Some recent data might be hidden     Patient questions:  Do you have a fever, pain , or abdominal swelling? No. Pain Score  0 *  Have you tolerated food without any problems? Yes.    Have you been able to return to your normal activities? Yes.    Do you have any questions about your discharge instructions: Diet   No. Medications  No. Follow up visit  No.  Do you have questions or concerns about your Care? No.  Actions: * If pain score is 4 or above: No action needed, pain <4.

## 2021-12-18 ENCOUNTER — Other Ambulatory Visit: Payer: Self-pay | Admitting: Family Medicine

## 2021-12-18 DIAGNOSIS — Z1231 Encounter for screening mammogram for malignant neoplasm of breast: Secondary | ICD-10-CM

## 2022-02-10 ENCOUNTER — Ambulatory Visit
Admission: RE | Admit: 2022-02-10 | Discharge: 2022-02-10 | Disposition: A | Discharge status: Home / Self Care | Payer: PPO | Source: Ambulatory Visit | Attending: Family Medicine | Admitting: Family Medicine

## 2022-02-10 DIAGNOSIS — Z1231 Encounter for screening mammogram for malignant neoplasm of breast: Secondary | ICD-10-CM

## 2022-09-25 ENCOUNTER — Telehealth: Payer: Self-pay

## 2022-09-25 NOTE — Telephone Encounter (Signed)
LVM for patient to call back [phone], or to call PCP office to schedule follow up apt. AS, CMA  

## 2022-12-03 ENCOUNTER — Other Ambulatory Visit: Payer: PPO

## 2022-12-10 ENCOUNTER — Encounter: Payer: PPO | Admitting: Family Medicine

## 2022-12-31 ENCOUNTER — Ambulatory Visit (INDEPENDENT_AMBULATORY_CARE_PROVIDER_SITE_OTHER): Payer: PPO | Admitting: Family Medicine

## 2022-12-31 ENCOUNTER — Encounter: Payer: Self-pay | Admitting: Family Medicine

## 2022-12-31 VITALS — BP 132/80 | HR 77 | Temp 98.4°F | Resp 16 | Ht 65.0 in | Wt 177.8 lb

## 2022-12-31 DIAGNOSIS — Z13228 Encounter for screening for other metabolic disorders: Secondary | ICD-10-CM

## 2022-12-31 DIAGNOSIS — Z13220 Encounter for screening for lipoid disorders: Secondary | ICD-10-CM | POA: Diagnosis not present

## 2022-12-31 DIAGNOSIS — Z1329 Encounter for screening for other suspected endocrine disorder: Secondary | ICD-10-CM

## 2022-12-31 DIAGNOSIS — Z0000 Encounter for general adult medical examination without abnormal findings: Secondary | ICD-10-CM

## 2022-12-31 DIAGNOSIS — Z13820 Encounter for screening for osteoporosis: Secondary | ICD-10-CM

## 2022-12-31 DIAGNOSIS — Z130 Encounter for screening for diseases of the blood and blood-forming organs and certain disorders involving the immune mechanism: Secondary | ICD-10-CM

## 2022-12-31 DIAGNOSIS — Z780 Asymptomatic menopausal state: Secondary | ICD-10-CM

## 2022-12-31 NOTE — Progress Notes (Signed)
-  Patient is here to have annually  complete physical examination  -Care gap address -labs taken  

## 2022-12-31 NOTE — Progress Notes (Signed)
Established Patient Office Visit  Subjective    Patient ID: Lindsey Dyer, female    DOB: 11/07/1955  Age: 67 y.o. MRN: [id number]  CC:  Chief Complaint  Patient presents with   Annual Exam    HPI Lindsey Dyer presents for routine annual exam. Patient doesn't not have any known chronic med issues and takes no regular meds. She denies acute complaints.   Outpatient Encounter Medications as of 12/31/2022  Medication Sig   Multiple Vitamin (MULTIVITAMIN) tablet Take 1 tablet by mouth daily.   Omega-3 Fatty Acids (FISH OIL PO) Take 1 capsule by mouth daily.    No facility-administered encounter medications on file as of 12/31/2022.    Past Medical History:  Diagnosis Date   Hypertension     Past Surgical History:  Procedure Laterality Date   COLONOSCOPY  09/21/2007   Arlyce Dice    TONSILLECTOMY      Family History  Problem Relation Age of Onset   Hypertension Mother    Stroke Father    Healthy Brother    Healthy Daughter    Healthy Son    Stroke Paternal Aunt    Healthy Brother    Cancer Brother        lung   Cancer Brother        prostate   Colon cancer Neg Hx    Colon polyps Neg Hx    Rectal cancer Neg Hx    Stomach cancer Neg Hx    Esophageal cancer Neg Hx     Social History   Socioeconomic History   Marital status: Married    Spouse name: Not on file   Number of children: Not on file   Years of education: Not on file   Highest education level: Associate degree: academic program  Occupational History   Not on file  Tobacco Use   Smoking status: Never   Smokeless tobacco: Never  Vaping Use   Vaping status: Never Used  Substance and Sexual Activity   Alcohol use: No   Drug use: No   Sexual activity: Yes    Birth control/protection: None, Post-menopausal  Other Topics Concern   Not on file  Social History Narrative   Not on file   Social Determinants of Health   Financial Resource Strain: Low Risk  (12/29/2022)   Overall Financial Resource  Strain (CARDIA)    Difficulty of Paying Living Expenses: Not hard at all  Food Insecurity: No Food Insecurity (12/29/2022)   Hunger Vital Sign    Worried About Running Out of Food in the Last Year: Never true    Ran Out of Food in the Last Year: Never true  Transportation Needs: No Transportation Needs (12/29/2022)   PRAPARE - Administrator, Civil Service (Medical): No    Lack of Transportation (Non-Medical): No  Physical Activity: Sufficiently Active (12/29/2022)   Exercise Vital Sign    Days of Exercise per Week: 4 days    Minutes of Exercise per Session: 40 min  Stress: No Stress Concern Present (12/29/2022)   [employer] of Occupational Health - Occupational Stress Questionnaire    Feeling of Stress : Not at all  Social Connections: Socially Integrated (12/29/2022)   Social Connection and Isolation Panel [NHANES]    Frequency of Communication with Friends and Family: Twice a week    Frequency of Social Gatherings with Friends and Family: Once a week    Attends Religious Services: More than 4 times per year  Active Member of Clubs or Organizations: Yes    Attends Banker Meetings: More than 4 times per year    Marital Status: Married  Catering manager Violence: Not At Risk (12/31/2022)   Humiliation, Afraid, Rape, and Kick questionnaire    Fear of Current or Ex-Partner: No    Emotionally Abused: No    Physically Abused: No    Sexually Abused: No    Review of Systems  All other systems reviewed and are negative.       Objective    BP 132/80   Pulse 77   Temp 98.4 F (36.9 C) (Oral)   Resp 16   Ht 5\' 5"  (1.651 m)   Wt 177 lb 12.8 oz (80.6 kg)   LMP 02/25/2007 (Within Years)   SpO2 96%   BMI 29.59 kg/m   Physical Exam Vitals and nursing note reviewed.  Constitutional:      General: She is not in acute distress. HENT:     Head: Normocephalic and atraumatic.     Right Ear: Tympanic membrane, ear canal and external ear normal.      Left Ear: Tympanic membrane, ear canal and external ear normal.     Nose: Nose normal.     Mouth/Throat:     Mouth: Mucous membranes are moist.     Pharynx: Oropharynx is clear.  Eyes:     Conjunctiva/sclera: Conjunctivae normal.     Pupils: Pupils are equal, round, and reactive to light.  Neck:     Thyroid: No thyromegaly.  Cardiovascular:     Rate and Rhythm: Normal rate and regular rhythm.     Heart sounds: Normal heart sounds. No murmur heard. Pulmonary:     Effort: Pulmonary effort is normal. No respiratory distress.     Breath sounds: Normal breath sounds.  Abdominal:     General: There is no distension.     Palpations: Abdomen is soft. There is no mass.     Tenderness: There is no abdominal tenderness.  Musculoskeletal:        General: Normal range of motion.     Cervical back: Normal range of motion and neck supple.  Skin:    General: Skin is warm and dry.  Neurological:     General: No focal deficit present.     Mental Status: She is alert and oriented to person, place, and time.  Psychiatric:        Mood and Affect: Mood normal.        Behavior: Behavior normal.         Assessment & Plan:   Annual physical exam -     CMP14+EGFR  Screening for deficiency anemia -     CBC with Differential/Platelet  Screening for lipid disorders -     Lipid panel  Screening for endocrine/metabolic/immunity disorders -     Hemoglobin A1c -     VITAMIN D 25 Hydroxy (Vit-D Deficiency, Fractures)  Encounter for osteoporosis screening in asymptomatic postmenopausal patient -     DG Bone Density; Future     Return in about 1 year (around 12/31/2023) for physical.   Tommie Raymond, MD

## 2023-01-01 LAB — CMP14+EGFR
ALT: 21 [IU]/L (ref 0–32)
AST: 23 [IU]/L (ref 0–40)
Albumin: 4.4 g/dL (ref 3.9–4.9)
Alkaline Phosphatase: 99 [IU]/L (ref 44–121)
BUN/Creatinine Ratio: 13 (ref 12–28)
BUN: 13 mg/dL (ref 8–27)
Bilirubin Total: 0.3 mg/dL (ref 0.0–1.2)
CO2: 22 mmol/L (ref 20–29)
Calcium: 9.5 mg/dL (ref 8.7–10.3)
Chloride: 104 mmol/L (ref 96–106)
Creatinine, Ser: 1.03 mg/dL — ABNORMAL HIGH (ref 0.57–1.00)
Globulin, Total: 2.8 g/dL (ref 1.5–4.5)
Glucose: 88 mg/dL (ref 70–99)
Potassium: 4 mmol/L (ref 3.5–5.2)
Sodium: 142 mmol/L (ref 134–144)
Total Protein: 7.2 g/dL (ref 6.0–8.5)
eGFR: 60 mL/min/{1.73_m2} (ref >59)

## 2023-01-01 LAB — LIPID PANEL
Chol/HDL Ratio: 4.2 ratio (ref 0.0–4.4)
Cholesterol, Total: 259 mg/dL — ABNORMAL HIGH (ref 100–199)
HDL: 62 mg/dL (ref >39)
LDL Chol Calc (NIH): 164 mg/dL — ABNORMAL HIGH (ref 0–99)
Triglycerides: 181 mg/dL — ABNORMAL HIGH (ref 0–149)
VLDL Cholesterol Cal: 33 mg/dL (ref 5–40)

## 2023-01-01 LAB — CBC WITH DIFFERENTIAL/PLATELET
Basophils Absolute: 0.1 10*3/uL (ref 0.0–0.2)
Basos: 1 %
EOS (ABSOLUTE): 0.2 10*3/uL (ref 0.0–0.4)
Eos: 3 %
Hematocrit: 46.1 % (ref 34.0–46.6)
Hemoglobin: 15.3 g/dL (ref 11.1–15.9)
Immature Grans (Abs): 0 10*3/uL (ref 0.0–0.1)
Immature Granulocytes: 0 %
Lymphocytes Absolute: 1.9 10*3/uL (ref 0.7–3.1)
Lymphs: 31 %
MCH: 30.6 pg (ref 26.6–33.0)
MCHC: 33.2 g/dL (ref 31.5–35.7)
MCV: 92 fL (ref 79–97)
Monocytes Absolute: 0.5 10*3/uL (ref 0.1–0.9)
Monocytes: 8 %
Neutrophils Absolute: 3.6 10*3/uL (ref 1.4–7.0)
Neutrophils: 57 %
Platelets: 266 10*3/uL (ref 150–450)
RBC: 5 x10E6/uL (ref 3.77–5.28)
RDW: 11.5 % — ABNORMAL LOW (ref 11.7–15.4)
WBC: 6.3 10*3/uL (ref 3.4–10.8)

## 2023-01-01 LAB — VITAMIN D 25 HYDROXY (VIT D DEFICIENCY, FRACTURES): Vit D, 25-Hydroxy: 42.9 ng/mL (ref 30.0–100.0)

## 2023-01-01 LAB — HEMOGLOBIN A1C
Est. average glucose Bld gHb Est-mCnc: 108 mg/dL
Hgb A1c MFr Bld: 5.4 % (ref 4.8–5.6)

## 2023-03-19 ENCOUNTER — Encounter: Payer: Self-pay | Admitting: Family Medicine

## 2023-03-19 ENCOUNTER — Ambulatory Visit (INDEPENDENT_AMBULATORY_CARE_PROVIDER_SITE_OTHER): Payer: PPO | Admitting: Family Medicine

## 2023-03-19 VITALS — BP 129/73 | HR 76 | Temp 98.1°F | Resp 16 | Ht 65.0 in | Wt 178.8 lb

## 2023-03-19 DIAGNOSIS — M25551 Pain in right hip: Secondary | ICD-10-CM

## 2023-03-19 NOTE — Progress Notes (Signed)
Established Patient Office Visit  Subjective    Patient ID: Lindsey Dyer, female    DOB: 1955/10/12  Age: 68 y.o. MRN: [id number]  CC:  Chief Complaint  Patient presents with   referral to [facility]    HPI Lindsey Dyer presents with complaint of right hip pain. Symptoms are intermittent. Patient denies known trauma or injury. Patient utilizing nsaids/tylenol for sx but would like PT.   Outpatient Encounter Medications as of 03/19/2023  Medication Sig   Multiple Vitamin (MULTIVITAMIN) tablet Take 1 tablet by mouth daily.   Omega-3 Fatty Acids (FISH OIL PO) Take 1 capsule by mouth daily.    No facility-administered encounter medications on file as of 03/19/2023.    Past Medical History:  Diagnosis Date   Hypertension     Past Surgical History:  Procedure Laterality Date   COLONOSCOPY  09/21/2007   Arlyce Dice    TONSILLECTOMY      Family History  Problem Relation Age of Onset   Hypertension Mother    Stroke Father    Healthy Brother    Healthy Daughter    Healthy Son    Stroke Paternal Aunt    Healthy Brother    Cancer Brother        lung   Cancer Brother        prostate   Colon cancer Neg Hx    Colon polyps Neg Hx    Rectal cancer Neg Hx    Stomach cancer Neg Hx    Esophageal cancer Neg Hx     Social History   Socioeconomic History   Marital status: Married    Spouse name: Not on file   Number of children: Not on file   Years of education: Not on file   Highest education level: Associate degree: occupational, Scientist, product/process development, or vocational program  Occupational History   Not on file  Tobacco Use   Smoking status: Never   Smokeless tobacco: Never  Vaping Use   Vaping status: Never Used  Substance and Sexual Activity   Alcohol use: No   Drug use: No   Sexual activity: Yes    Birth control/protection: None, Post-menopausal  Other Topics Concern   Not on file  Social History Narrative   Not on file   Social Drivers of Health   Financial Resource  Strain: Low Risk  (03/18/2023)   Overall Financial Resource Strain (CARDIA)    Difficulty of Paying Living Expenses: Not hard at all  Food Insecurity: No Food Insecurity (03/18/2023)   Hunger Vital Sign    Worried About Running Out of Food in the Last Year: Never true    Ran Out of Food in the Last Year: Never true  Transportation Needs: No Transportation Needs (03/18/2023)   PRAPARE - Administrator, Civil Service (Medical): No    Lack of Transportation (Non-Medical): No  Physical Activity: Sufficiently Active (12/29/2022)   Exercise Vital Sign    Days of Exercise per Week: 4 days    Minutes of Exercise per Session: 40 min  Stress: No Stress Concern Present (12/29/2022)   [employer] of Occupational Health - Occupational Stress Questionnaire    Feeling of Stress : Not at all  Social Connections: Moderately Integrated (03/18/2023)   Social Connection and Isolation Panel [NHANES]    Frequency of Communication with Friends and Family: Once a week    Frequency of Social Gatherings with Friends and Family: Once a week    Attends Religious Services: More than 4  times per year    Active Member of Clubs or Organizations: Yes    Attends Banker Meetings: More than 4 times per year    Marital Status: Married  Catering manager Violence: Not At Risk (12/31/2022)   Humiliation, Afraid, Rape, and Kick questionnaire    Fear of Current or Ex-Partner: No    Emotionally Abused: No    Physically Abused: No    Sexually Abused: No    Review of Systems  All other systems reviewed and are negative.       Objective    BP 129/73 (BP Location: Right Arm, Patient Position: Sitting, Cuff Size: Normal)   Pulse 76   Temp 98.1 F (36.7 C) (Oral)   Resp 16   Ht 5\' 5"  (1.651 m)   Wt 178 lb 12.8 oz (81.1 kg)   LMP 02/25/2007 (Within Years)   SpO2 98%   BMI 29.75 kg/m   Physical Exam Vitals and nursing note reviewed.  Constitutional:      General: She is not in acute  distress. Cardiovascular:     Rate and Rhythm: Normal rate and regular rhythm.  Pulmonary:     Effort: Pulmonary effort is normal.     Breath sounds: Normal breath sounds.  Musculoskeletal:     Left hip: Tenderness present. No deformity. Normal range of motion.  Neurological:     General: No focal deficit present.     Mental Status: She is alert and oriented to person, place, and time.     Gait: Gait normal.         Assessment & Plan:   Right hip pain -     Ambulatory referral to Physical Therapy     Return if symptoms worsen or fail to improve.   Tommie Raymond, MD

## 2023-03-20 ENCOUNTER — Encounter: Payer: Self-pay | Admitting: Family Medicine

## 2023-04-07 ENCOUNTER — Ambulatory Visit: Payer: PPO | Admitting: Physical Therapy

## 2023-04-07 ENCOUNTER — Encounter: Payer: Self-pay | Admitting: Physical Therapy

## 2023-04-07 DIAGNOSIS — M25551 Pain in right hip: Secondary | ICD-10-CM

## 2023-04-07 DIAGNOSIS — M6281 Muscle weakness (generalized): Secondary | ICD-10-CM | POA: Diagnosis not present

## 2023-04-07 NOTE — Therapy (Signed)
OUTPATIENT PHYSICAL THERAPY LOWER EXTREMITY EVALUATION   Patient Name: Lindsey Dyer MRN: [id number] DOB:1956/01/04, 68 y.o., female Today's Date: 04/07/2023  END OF SESSION:  PT End of Session - 04/07/23 1025     Visit Number 1    Number of Visits 16    Date for PT Re-Evaluation 06/16/23    PT Start Time 1015    PT Stop Time 1100    PT Time Calculation (min) 45 min    Activity Tolerance Patient tolerated treatment well    Behavior During Therapy [facility] for tasks assessed/performed             Past Medical History:  Diagnosis Date   Hypertension    Past Surgical History:  Procedure Laterality Date   COLONOSCOPY  09/21/2007   Arlyce Dice    TONSILLECTOMY     Patient Active Problem List   Diagnosis Date Noted   Vaginal atrophy 11/23/2017   Lesion of cervix 05/21/2017   Hypertension 04/16/2017   Lumbar radiculopathy 12/28/2015    PCP: Georganna Skeans, MD   REFERRING PROVIDER: Georganna Skeans, MD   REFERRING DIAG:  Diagnosis  M25.551 (ICD-10-CM) - Right hip pain    THERAPY DIAG:  Pain in right hip  Muscle weakness (generalized)  Rationale for Evaluation and Treatment: Rehabilitation  ONSET DATE:   SUBJECTIVE:   SUBJECTIVE STATEMENT: Pt arriving reporting Rt hip pain of 0/10. Pt stating worse pain is 6-7/10 when extending after prolonged sitting.   PERTINENT HISTORY: HTN  PAIN:  NPRS scale: no pain at rest  Pain location: Rt hip Pain description: achy Aggravating factors: bending, prolonged sitting Relieving factors: resting, changing positions  PRECAUTIONS: None  WEIGHT BEARING RESTRICTIONS: No  FALLS:  Has patient fallen in last 6 months? No  LIVING ENVIRONMENT: Lives with: lives with their family and lives with their spouse Lives in: House/apartment Stairs: Yes: Internal: 15 steps; on right going up and External: 2 steps; can reach both Has following equipment at home: None  OCCUPATION: retired  PLOF: Independent  PATIENT GOALS: be  able to walk and hike when going to [place] at end of April  Next MD visit:   OBJECTIVE:   DIAGNOSTIC FINDINGS:  12/28/2015: Mild degenerative disc space narrowing at L4-5 with facet joint hypertrophy at L4-5 and L5-S1. Mild anterolisthesis of L4 likely on the basis of degenerative disc and facet joint change. No compression fracture nor other significant bony abnormality.  PATIENT SURVEYS:  04/07/23: FOTO intake:  77%    COGNITION: Overall cognitive status: WFL    SENSATION: WFL   POSTURE:  rounded shoulders, forward head, and decreased lumbar lordosis    LOWER EXTREMITY ROM:   ROM Right 04/07/23 Left 04/07/23  Hip flexion 98 95  Hip extension    Hip abduction    Hip adduction    Hip internal rotation [facility]  Hip external rotation [facility]  Knee flexion    Knee extension    Ankle dorsiflexion    Ankle plantarflexion    Ankle inversion    Ankle eversion     (Blank rows = not tested)  LOWER EXTREMITY MMT:   MMT Right 04/07/23 Left 04/07/23  Hip flexion 4 4  Hip extension    Hip abduction 4 4+  Hip adduction 4 4+  Hip internal rotation    Hip external rotation    Knee flexion 5 5  Knee extension 5 5  Ankle dorsiflexion    Ankle plantarflexion    Ankle inversion  Ankle eversion     (Blank rows = not tested)  Palpation:  04/07/23 TTP: Rt piriformis, Rt QL  FUNCTIONAL TESTS:  04/07/23:  5 time sit to stand: 18 inch chair transfer: 14 seconds no UE support   GAIT: Distance walked: clinic distances Assistive device utilized: None Level of assistance: Complete Independence Comments: wide BOS                                                                                                                                                                        TODAY'S TREATMENT                                                                          DATE: 04/07/23: Therex: HEP instruction/performance c cues for techniques, handout provided.  Trial set  performed of each for comprehension and symptom assessment.  See below for exercise list  PATIENT EDUCATION:  Education details: HEP, POC Person educated: Patient Education method: Explanation, Demonstration, Verbal cues, and Handouts Education comprehension: verbalized understanding, returned demonstration, and verbal cues required  HOME EXERCISE PROGRAM: Access Code: [email or web address] URL: [email or web address] Date: 04/07/2023 Prepared by: Narda Amber  Exercises - Supine Bridge  - 2 x daily - 7 x weekly - 2 sets - 10 reps - 5 seconds hold - Supine Piriformis Stretch with Foot on Ground  - 2 x daily - 7 x weekly - 2 reps - 20 seconds hold - Supine Figure 4 Piriformis Stretch  - 2 x daily - 7 x weekly - 3 reps - 20-30 seconds hold - Supine Lower Trunk Rotation  - 2 x daily - 7 x weekly - 3 reps - 20 seconds hold - Sidelying Reverse Clamshell  - 2 x daily - 7 x weekly - 2 sets - 10 reps  ASSESSMENT:  CLINICAL IMPRESSION: Patient is a 68 y.o. who comes to clinic with complaints of right hip pain with mobility, strength and movement coordination deficits that impair their ability to perform usual daily and recreational functional activities without increase difficulty/symptoms at this time.  Patient to benefit from skilled PT services to address impairments and limitations to improve to previous level of function without restriction secondary to condition.   OBJECTIVE IMPAIRMENTS: decreased mobility, difficulty walking, decreased ROM, decreased strength, impaired flexibility, and pain.   ACTIVITY LIMITATIONS: bending, standing, squatting, and sleeping  PARTICIPATION LIMITATIONS: cleaning, laundry, driving, shopping, and community activity  PERSONAL FACTORS: 1 comorbidity: see pertinent history above  are  also affecting patient's functional outcome.   REHAB POTENTIAL: Good  CLINICAL DECISION MAKING: Stable/uncomplicated  EVALUATION COMPLEXITY: Low   GOALS: Goals  reviewed with patient? Yes  SHORT TERM GOALS: (target date for Short term goals are 3 weeks 04/28/2023)   1.  Patient will demonstrate independent use of home exercise program to maintain progress from in clinic treatments.  Goal status: New  LONG TERM GOALS: (target dates for all long term goals are 10 weeks  06/16/2023 )   1. Patient will demonstrate/report pain at worst less than or equal to 2/10 to facilitate minimal limitation in daily activity secondary to pain symptoms.  Goal status: New   2. Patient will demonstrate independent use of home exercise program to facilitate ability to maintain/progress functional gains from skilled physical therapy services.  Goal status: New   3. Patient will demonstrate FOTO outcome > or = 83 % to indicate reduced disability due to condition.  Goal status: New   4.  Patient will demonstrate Rt LE MMT 5/5 throughout to faciltiate usual transfers, stairs, squatting at [facility] for daily life.   Goal status: New   5.  Patient will demonstrate reciprocal gait pattern up and down 1 flight stairs with no rail.  Goal status: New   6.  Pt will be able to lift 10 # from floor to counter height with no pain using correct body mechanics.  Goal status: New      PLAN:  PT FREQUENCY: 1-2x/week  PT DURATION: 10 weeks  PLANNED INTERVENTIONS: Can include 25366- PT Re-evaluation, 97110-Therapeutic exercises, 97530- Therapeutic activity, [id number]- Neuromuscular re-education, 97535- Self Care, 97140- Manual therapy, [phone]- Gait training, [phone]- Orthotic Fit/training, [phone]- Canalith repositioning, [id number]- Aquatic Therapy, 97014- Electrical stimulation (unattended), [id number]- Electrical stimulation (manual), [id number] Physical performance testing, 97016- Vasopneumatic device, [id number]- Ultrasound, [id number]- Traction (mechanical), [id number]- Ionotophoresis 4mg /ml Dexamethasone, Patient/Family education, Balance training, Stair training, Taping, Dry Needling, Joint mobilization, Joint  manipulation, Spinal manipulation, Spinal mobilization, Scar mobilization, Vestibular training, Visual/preceptual remediation/compensation, DME instructions, Cryotherapy, and Moist heat.  All performed as medically necessary.  All included unless contraindicated  PLAN FOR NEXT SESSION: Review HEP knowledge/results, hip strengthening, further evaluate pt's pelvis mobility and alignment    Sharmon Leyden, PT, MPT 04/07/2023, 10:31 AM

## 2023-04-20 ENCOUNTER — Encounter: Payer: Self-pay | Admitting: Physical Therapy

## 2023-04-20 ENCOUNTER — Ambulatory Visit: Payer: PPO | Admitting: Physical Therapy

## 2023-04-20 DIAGNOSIS — M25551 Pain in right hip: Secondary | ICD-10-CM | POA: Diagnosis not present

## 2023-04-20 DIAGNOSIS — M6281 Muscle weakness (generalized): Secondary | ICD-10-CM | POA: Diagnosis not present

## 2023-04-20 NOTE — Therapy (Addendum)
 OUTPATIENT PHYSICAL THERAPY LOWER EXTREMITY   Patient Name: Lindsey Dyer MRN: [id number] DOB:05-01-1955, 68 y.o., female Today's Date: 04/20/2023  END OF SESSION:  PT End of Session - 04/20/23 1551     Visit Number 2    Number of Visits 16    Date for PT Re-Evaluation 06/16/23    PT Start Time 1518    PT Stop Time 1600    PT Time Calculation (min) 42 min    Activity Tolerance Patient tolerated treatment well    Behavior During Therapy [facility] for tasks assessed/performed              Past Medical History:  Diagnosis Date   Hypertension    Past Surgical History:  Procedure Laterality Date   COLONOSCOPY  09/21/2007   Arlyce Dice    TONSILLECTOMY     Patient Active Problem List   Diagnosis Date Noted   Vaginal atrophy 11/23/2017   Lesion of cervix 05/21/2017   Hypertension 04/16/2017   Lumbar radiculopathy 12/28/2015    PCP: Georganna Skeans, MD   REFERRING PROVIDER: Georganna Skeans, MD   REFERRING DIAG:  Diagnosis  M25.551 (ICD-10-CM) - Right hip pain    THERAPY DIAG:  Pain in right hip  Muscle weakness (generalized)  Rationale for Evaluation and Treatment: Rehabilitation  ONSET DATE:   SUBJECTIVE:   SUBJECTIVE STATEMENT: Pt reporting no pain upon arrival. Pt stating early today after she squatted her pain hit 6/10.   PERTINENT HISTORY: HTN  PAIN:  NPRS scale: no pain upon arrival, 6/10 when returning to stand after squatting Pain location: Rt hip Pain description: achy Aggravating factors: bending, prolonged sitting Relieving factors: resting, changing positions  PRECAUTIONS: None  WEIGHT BEARING RESTRICTIONS: No  FALLS:  Has patient fallen in last 6 months? No  LIVING ENVIRONMENT: Lives with: lives with their family and lives with their spouse Lives in: House/apartment Stairs: Yes: Internal: 15 steps; on right going up and External: 2 steps; can reach both Has following equipment at home: None  OCCUPATION: retired  PLOF:  Independent  PATIENT GOALS: be able to walk and hike when going to [place] at end of April  Next MD visit:   OBJECTIVE:   DIAGNOSTIC FINDINGS:  12/28/2015: Mild degenerative disc space narrowing at L4-5 with facet joint hypertrophy at L4-5 and L5-S1. Mild anterolisthesis of L4 likely on the basis of degenerative disc and facet joint change. No compression fracture nor other significant bony abnormality.  PATIENT SURVEYS:  04/07/23: FOTO intake:  77%    COGNITION: Overall cognitive status: WFL    SENSATION: WFL   POSTURE:  rounded shoulders, forward head, and decreased lumbar lordosis    LOWER EXTREMITY ROM:   ROM Right 04/07/23 Left 04/07/23  Hip flexion 98 95  Hip extension    Hip abduction    Hip adduction    Hip internal rotation [facility]  Hip external rotation [facility]  Knee flexion    Knee extension    Ankle dorsiflexion    Ankle plantarflexion    Ankle inversion    Ankle eversion     (Blank rows = not tested)  LOWER EXTREMITY MMT:   MMT Right 04/07/23 Left 04/07/23  Hip flexion 4 4  Hip extension    Hip abduction 4 4+  Hip adduction 4 4+  Hip internal rotation    Hip external rotation    Knee flexion 5 5  Knee extension 5 5  Ankle dorsiflexion    Ankle plantarflexion    Ankle  inversion    Ankle eversion     (Blank rows = not tested)  Palpation:  04/07/23 TTP: Rt piriformis, Rt QL  FUNCTIONAL TESTS:  04/07/23:  5 time sit to stand: 18 inch chair transfer: 14 seconds no UE support   GAIT: Distance walked: clinic distances Assistive device utilized: None Level of assistance: Complete Independence Comments: wide BOS                                                                                                                                                                        TODAY'S TREATMENT                                                                          DATE: 04/20/23: TherEx:  Nustep: level 4 UE/LE  Standing: hip  abduction 2 x 10 bil LE c UE support Standing hip extension 2 x 10 bil LE c UE support Supine bridge:  2 x 10 holding 5 sec Piriformis stretch: knee to opposite shoulder x 3 holding 30 sec Glute stretch: pushing knee away in figure 4 position x 3 holding 30 sec Reverse clams: 2 x 10 bil LE  TherAct:  Double Leg press: 75# 2 x 10  Single Leg press: 37# x 12 Side stepping: 20 feet x 4 (2 laps each direction)   TODAY'S TREATMENT                                                                          DATE: 04/07/23: Therex: HEP instruction/performance c cues for techniques, handout provided.  Trial set performed of each for comprehension and symptom assessment.  See below for exercise list  PATIENT EDUCATION:  Education details: HEP, POC Person educated: Patient Education method: Explanation, Demonstration, Verbal cues, and Handouts Education comprehension: verbalized understanding, returned demonstration, and verbal cues required  HOME EXERCISE PROGRAM: Access Code: [email or web address] URL: [email or web address] Date: 04/20/2023 Prepared by: Narda Amber  Exercises - Supine Bridge  - 2 x daily - 7 x weekly - 2 sets - 10 reps - 5 seconds hold - Supine Piriformis Stretch with Foot on Ground  - 2 x daily - 7 x weekly - 2 reps - 20  seconds hold - Supine Figure 4 Piriformis Stretch  - 2 x daily - 7 x weekly - 3 reps - 20-30 seconds hold - Supine Lower Trunk Rotation  - 2 x daily - 7 x weekly - 3 reps - 20 seconds hold - Hooklying Clamshell with Resistance  - 1 x daily - 7 x weekly - 2 sets - 10 reps - Sidelying Reverse Clamshell with Resistance  - 1 x daily - 7 x weekly - 2 sets - 10 reps  ASSESSMENT:  CLINICAL IMPRESSION: Pt able to return demonstration on all her HEP exercises. Pt tolerating all all other exercises well. Continue to recommend skilled PT interventions.  OBJECTIVE IMPAIRMENTS: decreased mobility, difficulty walking, decreased ROM, decreased strength, impaired  flexibility, and pain.   ACTIVITY LIMITATIONS: bending, standing, squatting, and sleeping  PARTICIPATION LIMITATIONS: cleaning, laundry, driving, shopping, and community activity  PERSONAL FACTORS: 1 comorbidity: see pertinent history above  are also affecting patient's functional outcome.   REHAB POTENTIAL: Good  CLINICAL DECISION MAKING: Stable/uncomplicated  EVALUATION COMPLEXITY: Low   GOALS: Goals reviewed with patient? Yes  SHORT TERM GOALS: (target date for Short term goals are 3 weeks 04/28/2023)   1.  Patient will demonstrate independent use of home exercise program to maintain progress from in clinic treatments.  Goal status: on-going 04/20/23  LONG TERM GOALS: (target dates for all long term goals are 10 weeks  06/16/2023 )   1. Patient will demonstrate/report pain at worst less than or equal to 2/10 to facilitate minimal limitation in daily activity secondary to pain symptoms.  Goal status: New   2. Patient will demonstrate independent use of home exercise program to facilitate ability to maintain/progress functional gains from skilled physical therapy services.  Goal status: New   3. Patient will demonstrate FOTO outcome > or = 83 % to indicate reduced disability due to condition.  Goal status: New   4.  Patient will demonstrate Rt LE MMT 5/5 throughout to faciltiate usual transfers, stairs, squatting at [facility] for daily life.   Goal status: New   5.  Patient will demonstrate reciprocal gait pattern up and down 1 flight stairs with no rail.  Goal status: New   6.  Pt will be able to lift 10 # from floor to counter height with no pain using correct body mechanics.  Goal status: New      PLAN:  PT FREQUENCY: 1-2x/week  PT DURATION: 10 weeks  PLANNED INTERVENTIONS: Can include 28413- PT Re-evaluation, 97110-Therapeutic exercises, 97530- Therapeutic activity, [id number]- Neuromuscular re-education, 97535- Self Care, 97140- Manual therapy, [phone]- Gait training,  [phone]- Orthotic Fit/training, [phone]- Canalith repositioning, [id number]- Aquatic Therapy, 97014- Electrical stimulation (unattended), [id number]- Electrical stimulation (manual), [id number] Physical performance testing, 97016- Vasopneumatic device, [id number]- Ultrasound, [id number]- Traction (mechanical), [id number]- Ionotophoresis 4mg /ml Dexamethasone, Patient/Family education, Balance training, Stair training, Taping, Dry Needling, Joint mobilization, Joint manipulation, Spinal manipulation, Spinal mobilization, Scar mobilization, Vestibular training, Visual/preceptual remediation/compensation, DME instructions, Cryotherapy, and Moist heat.  All performed as medically necessary.  All included unless contraindicated  PLAN FOR NEXT SESSION: hip strengthening, functional mobility, manual as needed  Sharmon Leyden, PT, MPT 04/20/2023, 4:13 PM

## 2023-04-30 ENCOUNTER — Encounter: Payer: PPO | Admitting: Physical Therapy

## 2023-05-04 ENCOUNTER — Encounter: Payer: Self-pay | Admitting: Physical Therapy

## 2023-05-04 ENCOUNTER — Ambulatory Visit: Payer: PPO | Admitting: Physical Therapy

## 2023-05-04 DIAGNOSIS — M6281 Muscle weakness (generalized): Secondary | ICD-10-CM

## 2023-05-04 DIAGNOSIS — M25551 Pain in right hip: Secondary | ICD-10-CM

## 2023-05-04 NOTE — Therapy (Signed)
 OUTPATIENT PHYSICAL THERAPY LOWER EXTREMITY   Patient Name: Lindsey Dyer MRN: [id number] DOB:April 22, 1955, 68 y.o., female Today's Date: 05/04/2023  END OF SESSION:  PT End of Session - 05/04/23 0952     Visit Number 3    Number of Visits 16    Date for PT Re-Evaluation 06/16/23    PT Start Time 0925    PT Stop Time 1006    PT Time Calculation (min) 41 min    Activity Tolerance Patient tolerated treatment well    Behavior During Therapy [facility] for tasks assessed/performed               Past Medical History:  Diagnosis Date   Hypertension    Past Surgical History:  Procedure Laterality Date   COLONOSCOPY  09/21/2007   Arlyce Dice    TONSILLECTOMY     Patient Active Problem List   Diagnosis Date Noted   Vaginal atrophy 11/23/2017   Lesion of cervix 05/21/2017   Hypertension 04/16/2017   Lumbar radiculopathy 12/28/2015    PCP: Georganna Skeans, MD   REFERRING PROVIDER: Georganna Skeans, MD   REFERRING DIAG:  Diagnosis  M25.551 (ICD-10-CM) - Right hip pain    THERAPY DIAG:  Pain in right hip  Muscle weakness (generalized)  Rationale for Evaluation and Treatment: Rehabilitation  ONSET DATE:   SUBJECTIVE:   SUBJECTIVE STATEMENT: Pt arriving today reporting 7-8/10 intermittently at times. Pt stating she sat in the floor this weekend with her grand daughter and she can tell it aggravated her hip.   PERTINENT HISTORY: HTN  PAIN:  NPRS scale: no pain upon arrival, 7-8/10 when returning to stand after squatting Pain location: Rt hip Pain description: achy Aggravating factors: bending, prolonged sitting Relieving factors: resting, changing positions  PRECAUTIONS: None  WEIGHT BEARING RESTRICTIONS: No  FALLS:  Has patient fallen in last 6 months? No  LIVING ENVIRONMENT: Lives with: lives with their family and lives with their spouse Lives in: House/apartment Stairs: Yes: Internal: 15 steps; on right going up and External: 2 steps; can reach both Has  following equipment at home: None  OCCUPATION: retired  PLOF: Independent  PATIENT GOALS: be able to walk and hike when going to [place] at end of April  Next MD visit:   OBJECTIVE:   DIAGNOSTIC FINDINGS:  12/28/2015: Mild degenerative disc space narrowing at L4-5 with facet joint hypertrophy at L4-5 and L5-S1. Mild anterolisthesis of L4 likely on the basis of degenerative disc and facet joint change. No compression fracture nor other significant bony abnormality.  PATIENT SURVEYS:  04/07/23: FOTO intake:  77%    COGNITION: Overall cognitive status: WFL    SENSATION: WFL   POSTURE:  rounded shoulders, forward head, and decreased lumbar lordosis    LOWER EXTREMITY ROM:   ROM Right 04/07/23 Left 04/07/23  Hip flexion 98 95  Hip extension    Hip abduction    Hip adduction    Hip internal rotation [facility]  Hip external rotation [facility]  Knee flexion    Knee extension    Ankle dorsiflexion    Ankle plantarflexion    Ankle inversion    Ankle eversion     (Blank rows = not tested)  LOWER EXTREMITY MMT:   MMT Right 04/07/23 Left 04/07/23  Hip flexion 4 4  Hip extension    Hip abduction 4 4+  Hip adduction 4 4+  Hip internal rotation    Hip external rotation    Knee flexion 5 5  Knee extension  5 5  Ankle dorsiflexion    Ankle plantarflexion    Ankle inversion    Ankle eversion     (Blank rows = not tested)  Palpation:  04/07/23 TTP: Rt piriformis, Rt QL  FUNCTIONAL TESTS:  04/07/23:  5 time sit to stand: 18 inch chair transfer: 14 seconds no UE support   GAIT: Distance walked: clinic distances Assistive device utilized: None Level of assistance: Complete Independence Comments: wide BOS                                                                                                                                                                       TODAY'S TREATMENT                                                                           DATE: 05/04/23: TherEx:  Seated hamstring x 2 bil LE's  Nustep: level 4 UE/LE  Seated piriformis stretch: x 2 holding 30 sec Seated glute stretch: x 2 holding 30 sec Standing hip flexor stretch: x 2 bil LE 30 sec Prone hip extension:  x 10 bil LE's  Quadraped hip extension: x 10 holidng 5 sec c core activation Child's pose x 3 holding 20 sec Side lying reverse clams: red TB 2 x 10  There Activities:  Leg Press: bil LE's 75# x 20, single leg 37# bil LE's 2 x 10 Standing tapping over 6 inch hurdles placed front and lateral: x 10 c hip rotation bil       TODAY'S TREATMENT                                                                          DATE: 04/20/23: TherEx:  Nustep: level 4 UE/LE  Standing: hip abduction 2 x 10 bil LE c UE support Standing hip extension 2 x 10 bil LE c UE support Supine bridge:  2 x 10 holding 5 sec Piriformis stretch: knee to opposite shoulder x 3 holding 30 sec Glute stretch: pushing knee away in figure 4 position x 3 holding 30 sec Reverse clams: 2 x 10 bil LE  TherAct:  Double Leg press: 75# 2 x 10  Single Leg press: 37#  x 12 Side stepping: 20 feet x 4 (2 laps each direction)   TODAY'S TREATMENT                                                                          DATE: 04/07/23: Therex: HEP instruction/performance c cues for techniques, handout provided.  Trial set performed of each for comprehension and symptom assessment.  See below for exercise list  PATIENT EDUCATION:  Education details: HEP, POC Person educated: Patient Education method: Explanation, Demonstration, Verbal cues, and Handouts Education comprehension: verbalized understanding, returned demonstration, and verbal cues required  HOME EXERCISE PROGRAM: Access Code: [email or web address] URL: [email or web address] Date: 05/04/2023 Prepared by: Narda Amber  Exercises - Supine Bridge  - 2 x daily - 7 x weekly - 2 sets - 10 reps - 5 seconds hold - Supine Piriformis  Stretch with Foot on Ground  - 2 x daily - 7 x weekly - 2 reps - 20 seconds hold - Supine Figure 4 Piriformis Stretch  - 2 x daily - 7 x weekly - 3 reps - 20-30 seconds hold - Supine Lower Trunk Rotation  - 2 x daily - 7 x weekly - 3 reps - 20 seconds hold - Hooklying Clamshell with Resistance  - 1 x daily - 7 x weekly - 2 sets - 10 reps - Sidelying Reverse Clamshell with Resistance  - 1 x daily - 7 x weekly - 2 sets - 10 reps - Hooklying Hamstring Stretch with Strap  - 1 x daily - 7 x weekly - 2-3 reps - 30 seconds hold - Seated Hamstring Stretch  - 1 x daily - 7 x weekly - 2-3 reps - 30 seconds hold - Prone Hip Extension  - 1 x daily - 7 x weekly - 10-15 reps  ASSESSMENT:  CLINICAL IMPRESSION: Pt arriving today reporting more pain due to pt's playing on floor with her grand daughter this weekend. Pt tolerating all exercises well. HEP reviewed and updated this visit. Continue treatment plan.   OBJECTIVE IMPAIRMENTS: decreased mobility, difficulty walking, decreased ROM, decreased strength, impaired flexibility, and pain.   ACTIVITY LIMITATIONS: bending, standing, squatting, and sleeping  PARTICIPATION LIMITATIONS: cleaning, laundry, driving, shopping, and community activity  PERSONAL FACTORS: 1 comorbidity: see pertinent history above  are also affecting patient's functional outcome.   REHAB POTENTIAL: Good  CLINICAL DECISION MAKING: Stable/uncomplicated  EVALUATION COMPLEXITY: Low   GOALS: Goals reviewed with patient? Yes  SHORT TERM GOALS: (target date for Short term goals are 3 weeks 04/28/2023)   1.  Patient will demonstrate independent use of home exercise program to maintain progress from in clinic treatments.  Goal status: on-going 04/20/23  LONG TERM GOALS: (target dates for all long term goals are 10 weeks  06/16/2023 )   1. Patient will demonstrate/report pain at worst less than or equal to 2/10 to facilitate minimal limitation in daily activity secondary to pain  symptoms.  Goal status: New   2. Patient will demonstrate independent use of home exercise program to facilitate ability to maintain/progress functional gains from skilled physical therapy services.  Goal status: New   3. Patient will demonstrate FOTO outcome > or = 83 % to indicate reduced disability due to condition.  Goal status:  New   4.  Patient will demonstrate Rt LE MMT 5/5 throughout to faciltiate usual transfers, stairs, squatting at [facility] for daily life.   Goal status: New   5.  Patient will demonstrate reciprocal gait pattern up and down 1 flight stairs with no rail.  Goal status: New   6.  Pt will be able to lift 10 # from floor to counter height with no pain using correct body mechanics.  Goal status: New      PLAN:  PT FREQUENCY: 1-2x/week  PT DURATION: 10 weeks  PLANNED INTERVENTIONS: Can include 16109- PT Re-evaluation, 97110-Therapeutic exercises, 97530- Therapeutic activity, [id number]- Neuromuscular re-education, 97535- Self Care, 97140- Manual therapy, [phone]- Gait training, [phone]- Orthotic Fit/training, [phone]- Canalith repositioning, [id number]- Aquatic Therapy, 97014- Electrical stimulation (unattended), [id number]- Electrical stimulation (manual), [id number] Physical performance testing, 97016- Vasopneumatic device, [id number]- Ultrasound, [id number]- Traction (mechanical), [id number]- Ionotophoresis 4mg /ml Dexamethasone, Patient/Family education, Balance training, Stair training, Taping, Dry Needling, Joint mobilization, Joint manipulation, Spinal manipulation, Spinal mobilization, Scar mobilization, Vestibular training, Visual/preceptual remediation/compensation, DME instructions, Cryotherapy, and Moist heat.  All performed as medically necessary.  All included unless contraindicated  PLAN FOR NEXT SESSION: hip strengthening, functional mobility, manual as needed  Sharmon Leyden, PT, MPT 05/04/2023, 10:04 AM

## 2023-05-15 ENCOUNTER — Ambulatory Visit: Payer: PPO | Admitting: Physical Therapy

## 2023-05-15 DIAGNOSIS — M25551 Pain in right hip: Secondary | ICD-10-CM | POA: Diagnosis not present

## 2023-05-15 DIAGNOSIS — M6281 Muscle weakness (generalized): Secondary | ICD-10-CM | POA: Diagnosis not present

## 2023-05-15 NOTE — Therapy (Signed)
 OUTPATIENT PHYSICAL THERAPY LOWER EXTREMITY   Patient Name: Lindsey Dyer MRN: [id number] DOB:10-09-1955, 68 y.o., female Today's Date: 05/15/2023  END OF SESSION:  PT End of Session - 05/15/23 1016     Visit Number 4    Number of Visits 16    Date for PT Re-Evaluation 06/16/23    PT Start Time 1015    PT Stop Time 1053    PT Time Calculation (min) 38 min    Activity Tolerance Patient tolerated treatment well    Behavior During Therapy [facility] for tasks assessed/performed                Past Medical History:  Diagnosis Date   Hypertension    Past Surgical History:  Procedure Laterality Date   COLONOSCOPY  09/21/2007   Arlyce Dice    TONSILLECTOMY     Patient Active Problem List   Diagnosis Date Noted   Vaginal atrophy 11/23/2017   Lesion of cervix 05/21/2017   Hypertension 04/16/2017   Lumbar radiculopathy 12/28/2015    PCP: Georganna Skeans, MD   REFERRING PROVIDER: Georganna Skeans, MD   REFERRING DIAG:  Diagnosis  M25.551 (ICD-10-CM) - Right hip pain    THERAPY DIAG:  Pain in right hip  Muscle weakness (generalized)  Rationale for Evaluation and Treatment: Rehabilitation  ONSET DATE:   SUBJECTIVE:   SUBJECTIVE STATEMENT: Pain is worse with getting up from seated position.  Planning some trips as well as moving.  PERTINENT HISTORY: HTN  PAIN:  NPRS scale: no pain upon arrival, 7-8/10 when returning to stand after squatting Pain location: Rt hip Pain description: achy Aggravating factors: bending, prolonged sitting Relieving factors: resting, changing positions  PRECAUTIONS: None  WEIGHT BEARING RESTRICTIONS: No  FALLS:  Has patient fallen in last 6 months? No  LIVING ENVIRONMENT: Lives with: lives with their family and lives with their spouse Lives in: House/apartment Stairs: Yes: Internal: 15 steps; on right going up and External: 2 steps; can reach both Has following equipment at home: None  OCCUPATION: retired  PLOF:  Independent  PATIENT GOALS: be able to walk and hike when going to [place] at end of April  Next MD visit:   OBJECTIVE:   DIAGNOSTIC FINDINGS:  12/28/2015: Mild degenerative disc space narrowing at L4-5 with facet joint hypertrophy at L4-5 and L5-S1. Mild anterolisthesis of L4 likely on the basis of degenerative disc and facet joint change. No compression fracture nor other significant bony abnormality.  PATIENT SURVEYS:  04/07/23: FOTO intake:  77%    COGNITION: Overall cognitive status: WFL    SENSATION: WFL   POSTURE:  rounded shoulders, forward head, and decreased lumbar lordosis    LOWER EXTREMITY ROM:   ROM Right 04/07/23 Left 04/07/23  Hip flexion 98 95  Hip extension    Hip abduction    Hip adduction    Hip internal rotation [facility]  Hip external rotation [facility]  Knee flexion    Knee extension    Ankle dorsiflexion    Ankle plantarflexion    Ankle inversion    Ankle eversion     (Blank rows = not tested)  LOWER EXTREMITY MMT:   MMT Right 04/07/23 Left 04/07/23  Hip flexion 4 4  Hip extension    Hip abduction 4 4+  Hip adduction 4 4+  Hip internal rotation    Hip external rotation    Knee flexion 5 5  Knee extension 5 5  Ankle dorsiflexion    Ankle plantarflexion  Ankle inversion    Ankle eversion     (Blank rows = not tested)  Palpation:  04/07/23 TTP: Rt piriformis, Rt QL  FUNCTIONAL TESTS:  04/07/23:  5 time sit to stand: 18 inch chair transfer: 14 seconds no UE support   GAIT: Distance walked: clinic distances Assistive device utilized: None Level of assistance: Complete Independence Comments: wide BOS                                                                                                                                                                       TODAY'S TREATMENT DATE: 05/15/23 TherEx NuStep L4 x 8 min Review of bridges with min cues x 10 cues Opp arm/leg isometric hip flexion 10 x 5 sec bil Hooklying  single limb clamshell 2x10 bil; L4 band RDL 12# 2x10; min cues for technique   05/04/23: TherEx:  Seated hamstring x 2 bil LE's  Nustep: level 4 UE/LE  Seated piriformis stretch: x 2 holding 30 sec Seated glute stretch: x 2 holding 30 sec Standing hip flexor stretch: x 2 bil LE 30 sec Prone hip extension:  x 10 bil LE's  Quadraped hip extension: x 10 holidng 5 sec c core activation Child's pose x 3 holding 20 sec Side lying reverse clams: red TB 2 x 10   There Activities:  Leg Press: bil LE's 75# x 20, single leg 37# bil LE's 2 x 10 Standing tapping over 6 inch hurdles placed front and lateral: x 10 c hip rotation bil     04/20/23: TherEx:  Nustep: level 4 UE/LE  Standing: hip abduction 2 x 10 bil LE c UE support Standing hip extension 2 x 10 bil LE c UE support Supine bridge:  2 x 10 holding 5 sec Piriformis stretch: knee to opposite shoulder x 3 holding 30 sec Glute stretch: pushing knee away in figure 4 position x 3 holding 30 sec Reverse clams: 2 x 10 bil LE  TherAct:  Double Leg press: 75# 2 x 10  Single Leg press: 37# x 12 Side stepping: 20 feet x 4 (2 laps each direction)   04/07/23: Therex: HEP instruction/performance c cues for techniques, handout provided.  Trial set performed of each for comprehension and symptom assessment.  See below for exercise list  PATIENT EDUCATION:  Education details: HEP, POC Person educated: Patient Education method: Explanation, Demonstration, Verbal cues, and Handouts Education comprehension: verbalized understanding, returned demonstration, and verbal cues required  HOME EXERCISE PROGRAM: Access Code: [email or web address] URL: [email or web address] Date: 05/15/2023 Prepared by: Moshe Cipro  Exercises - Supine Bridge  - 2 x daily - 7 x weekly - 2 sets - 10 reps - 5 seconds hold - Supine Piriformis Stretch with Foot on Ground  - 2  x daily - 7 x weekly - 2 reps - 20 seconds hold - Supine Figure 4 Piriformis Stretch   - 2 x daily - 7 x weekly - 3 reps - 20-30 seconds hold - Supine Lower Trunk Rotation  - 2 x daily - 7 x weekly - 3 reps - 20 seconds hold - Hooklying Clamshell with Resistance  - 1 x daily - 7 x weekly - 2 sets - 10 reps - Sidelying Reverse Clamshell with Resistance  - 1 x daily - 7 x weekly - 2 sets - 10 reps - Hooklying Hamstring Stretch with Strap  - 1 x daily - 7 x weekly - 2-3 reps - 30 seconds hold - Seated Hamstring Stretch  - 1 x daily - 7 x weekly - 2-3 reps - 30 seconds hold - Prone Hip Extension  - 1 x daily - 7 x weekly - 10-15 reps - Hooklying Isometric Hip Flexion with Opposite Arm  - 1 x daily - 7 x weekly - 2 sets - 10 reps - 5 sec hold - Hooklying Single Leg Bent Knee Fallouts with Resistance  - 1 x daily - 7 x weekly - 2 sets - 10 reps - Kettlebell Deadlift  - 1 x daily - 7 x weekly - 3 sets - 10 reps  ASSESSMENT:  CLINICAL IMPRESSION: Pt tolerated session well today; she is planning to d/c next visit so updated HEP in preparation for d/c.    OBJECTIVE IMPAIRMENTS: decreased mobility, difficulty walking, decreased ROM, decreased strength, impaired flexibility, and pain.   ACTIVITY LIMITATIONS: bending, standing, squatting, and sleeping  PARTICIPATION LIMITATIONS: cleaning, laundry, driving, shopping, and community activity  PERSONAL FACTORS: 1 comorbidity: see pertinent history above  are also affecting patient's functional outcome.   REHAB POTENTIAL: Good  CLINICAL DECISION MAKING: Stable/uncomplicated  EVALUATION COMPLEXITY: Low   GOALS: Goals reviewed with patient? Yes  SHORT TERM GOALS: (target date for Short term goals are 3 weeks 04/28/2023)   1.  Patient will demonstrate independent use of home exercise program to maintain progress from in clinic treatments.  Goal status: on-going 04/20/23  LONG TERM GOALS: (target dates for all long term goals are 10 weeks  06/16/2023 )   1. Patient will demonstrate/report pain at worst less than or equal to 2/10 to  facilitate minimal limitation in daily activity secondary to pain symptoms.  Goal status: New   2. Patient will demonstrate independent use of home exercise program to facilitate ability to maintain/progress functional gains from skilled physical therapy services.  Goal status: New   3. Patient will demonstrate FOTO outcome > or = 83 % to indicate reduced disability due to condition.  Goal status: New   4.  Patient will demonstrate Rt LE MMT 5/5 throughout to faciltiate usual transfers, stairs, squatting at [facility] for daily life.   Goal status: New   5.  Patient will demonstrate reciprocal gait pattern up and down 1 flight stairs with no rail.  Goal status: New   6.  Pt will be able to lift 10 # from floor to counter height with no pain using correct body mechanics.  Goal status: New      PLAN:  PT FREQUENCY: 1-2x/week  PT DURATION: 10 weeks  PLANNED INTERVENTIONS: Can include 16109- PT Re-evaluation, 97110-Therapeutic exercises, 97530- Therapeutic activity, [id number]- Neuromuscular re-education, 97535- Self Care, 97140- Manual therapy, [id number]- Gait training, [phone]- Orthotic Fit/training, [id number]- Canalith repositioning, [id number]- Aquatic Therapy, 97014- Electrical stimulation (unattended), [id number]- Electrical stimulation (manual), [id number] Physical  performance testing, 52841- Vasopneumatic device, [id number]- Ultrasound, [id number]- Traction (mechanical), [id number]- Ionotophoresis 4mg /ml Dexamethasone, Patient/Family education, Balance training, Stair training, Taping, Dry Needling, Joint mobilization, Joint manipulation, Spinal manipulation, Spinal mobilization, Scar mobilization, Vestibular training, Visual/preceptual remediation/compensation, DME instructions, Cryotherapy, and Moist heat.  All performed as medically necessary.  All included unless contraindicated  PLAN FOR NEXT SESSION: likely d/c,  hip strengthening, functional mobility, manual as needed    Clarita Crane, PT, DPT 05/15/23 10:56  AM

## 2023-05-18 ENCOUNTER — Encounter: Payer: PPO | Admitting: Physical Therapy

## 2023-05-21 ENCOUNTER — Ambulatory Visit: Admitting: Physical Therapy

## 2023-05-21 ENCOUNTER — Encounter: Payer: Self-pay | Admitting: Physical Therapy

## 2023-05-21 DIAGNOSIS — M25551 Pain in right hip: Secondary | ICD-10-CM

## 2023-05-21 DIAGNOSIS — M6281 Muscle weakness (generalized): Secondary | ICD-10-CM

## 2023-05-21 NOTE — Therapy (Addendum)
 OUTPATIENT PHYSICAL THERAPY LOWER EXTREMITY  DISCHARGE SUMMARY  Patient Name: Lindsey Dyer MRN: [id number] DOB:1955/11/01, 68 y.o., female Today's Date: 05/21/2023  END OF SESSION:  PT End of Session - 05/21/23 1013     Visit Number 5    Number of Visits 16    Date for PT Re-Evaluation 06/16/23    PT Start Time 1015    PT Stop Time 1042    PT Time Calculation (min) 27 min    Activity Tolerance Patient tolerated treatment well    Behavior During Therapy [facility] for tasks assessed/performed                 Past Medical History:  Diagnosis Date   Hypertension    Past Surgical History:  Procedure Laterality Date   COLONOSCOPY  09/21/2007   Debrah    TONSILLECTOMY     Patient Active Problem List   Diagnosis Date Noted   Vaginal atrophy 11/23/2017   Lesion of cervix 05/21/2017   Hypertension 04/16/2017   Lumbar radiculopathy 12/28/2015    PCP: Tanda Bleacher, MD   REFERRING PROVIDER: Tanda Bleacher, MD   REFERRING DIAG:  Diagnosis  M25.551 (ICD-10-CM) - Right hip pain    THERAPY DIAG:  Pain in right hip  Muscle weakness (generalized)  Rationale for Evaluation and Treatment: Rehabilitation  ONSET DATE:   SUBJECTIVE:   SUBJECTIVE STATEMENT: Pain is intermittent at times; some times no issues but other times has pain.  PERTINENT HISTORY: HTN  PAIN:  NPRS scale: no pain upon arrival, 7-8/10 when returning to stand after squatting Pain location: Rt hip Pain description: achy Aggravating factors: bending, prolonged sitting Relieving factors: resting, changing positions  PRECAUTIONS: None  WEIGHT BEARING RESTRICTIONS: No  FALLS:  Has patient fallen in last 6 months? No  LIVING ENVIRONMENT: Lives with: lives with their family and lives with their spouse Lives in: House/apartment Stairs: Yes: Internal: 15 steps; on right going up and External: 2 steps; can reach both Has following equipment at home: None  OCCUPATION: retired  PLOF:  Independent  PATIENT GOALS: be able to walk and hike when going to [place]  at end of April  Next MD visit:   OBJECTIVE:   DIAGNOSTIC FINDINGS:  12/28/2015: Mild degenerative disc space narrowing at L4-5 with facet joint hypertrophy at L4-5 and L5-S1. Mild anterolisthesis of L4 likely on the basis of degenerative disc and facet joint change. No compression fracture nor other significant bony abnormality.  PATIENT SURVEYS:  04/07/23: FOTO intake:  77%  05/21/23: FOTO 67   COGNITION: Overall cognitive status: WFL    SENSATION: WFL   POSTURE:  rounded shoulders, forward head, and decreased lumbar lordosis    LOWER EXTREMITY ROM:   ROM Right 04/07/23 Left 04/07/23  Hip flexion 98 95  Hip extension    Hip abduction    Hip adduction    Hip internal rotation [facility]  Hip external rotation [facility]  Knee flexion    Knee extension    Ankle dorsiflexion    Ankle plantarflexion    Ankle inversion    Ankle eversion     (Blank rows = not tested)  LOWER EXTREMITY MMT:   MMT Right 04/07/23 Left 04/07/23 Right 05/21/23  Hip flexion 4 4 4+/5  Hip extension     Hip abduction 4 4+ 5/5  Hip adduction 4 4+ 4+/5  Hip internal rotation     Hip external rotation     Knee flexion 5 5   Knee extension  5 5   Ankle dorsiflexion     Ankle plantarflexion     Ankle inversion     Ankle eversion      (Blank rows = not tested)  Palpation:  04/07/23 TTP: Rt piriformis, Rt QL  FUNCTIONAL TESTS:  04/07/23:  5 time sit to stand: 18 inch chair transfer: 14 seconds no UE support   GAIT: Distance walked: clinic distances Assistive device utilized: None Level of assistance: Complete Independence Comments: wide BOS                                                                                                                                                                       TODAY'S TREATMENT DATE: 05/21/23 TherEx NuStep L7 x 8 min Discussed current exercise program and routine;  overall recommended continuation of current HEP with addition of more generalized exercise program (discussed app based options) as she feels comfortable.    Self Care Review of goals and POC as well going forward  05/15/23 TherEx NuStep L4 x 8 min Review of bridges with min cues x 10 cues Opp arm/leg isometric hip flexion 10 x 5 sec bil Hooklying single limb clamshell 2x10 bil; L4 band RDL 12# 2x10; min cues for technique   05/04/23: TherEx:  Seated hamstring x 2 bil LE's  Nustep: level 4 UE/LE  Seated piriformis stretch: x 2 holding 30 sec Seated glute stretch: x 2 holding 30 sec Standing hip flexor stretch: x 2 bil LE 30 sec Prone hip extension:  x 10 bil LE's  Quadraped hip extension: x 10 holidng 5 sec c core activation Child's pose x 3 holding 20 sec Side lying reverse clams: red TB 2 x 10   There Activities:  Leg Press: bil LE's 75# x 20, single leg 37# bil LE's 2 x 10 Standing tapping over 6 inch hurdles placed front and lateral: x 10 c hip rotation bil     04/20/23: TherEx:  Nustep: level 4 UE/LE  Standing: hip abduction 2 x 10 bil LE c UE support Standing hip extension 2 x 10 bil LE c UE support Supine bridge:  2 x 10 holding 5 sec Piriformis stretch: knee to opposite shoulder x 3 holding 30 sec Glute stretch: pushing knee away in figure 4 position x 3 holding 30 sec Reverse clams: 2 x 10 bil LE  TherAct:  Double Leg press: 75# 2 x 10  Single Leg press: 37# x 12 Side stepping: 20 feet x 4 (2 laps each direction)   04/07/23: Therex: HEP instruction/performance c cues for techniques, handout provided.  Trial set performed of each for comprehension and symptom assessment.  See below for exercise list  PATIENT EDUCATION:  Education details: HEP, POC Person educated: Patient Education  method: Explanation, Demonstration, Verbal cues, and Handouts Education comprehension: verbalized understanding, returned demonstration, and verbal cues required  HOME EXERCISE  PROGRAM: Access Code: [email or web address] URL: [email or web address] Date: 05/15/2023 Prepared by: Corean Ku  Exercises - Supine Bridge  - 2 x daily - 7 x weekly - 2 sets - 10 reps - 5 seconds hold - Supine Piriformis Stretch with Foot on Ground  - 2 x daily - 7 x weekly - 2 reps - 20 seconds hold - Supine Figure 4 Piriformis Stretch  - 2 x daily - 7 x weekly - 3 reps - 20-30 seconds hold - Supine Lower Trunk Rotation  - 2 x daily - 7 x weekly - 3 reps - 20 seconds hold - Hooklying Clamshell with Resistance  - 1 x daily - 7 x weekly - 2 sets - 10 reps - Sidelying Reverse Clamshell with Resistance  - 1 x daily - 7 x weekly - 2 sets - 10 reps - Hooklying Hamstring Stretch with Strap  - 1 x daily - 7 x weekly - 2-3 reps - 30 seconds hold - Seated Hamstring Stretch  - 1 x daily - 7 x weekly - 2-3 reps - 30 seconds hold - Prone Hip Extension  - 1 x daily - 7 x weekly - 10-15 reps - Hooklying Isometric Hip Flexion with Opposite Arm  - 1 x daily - 7 x weekly - 2 sets - 10 reps - 5 sec hold - Hooklying Single Leg Bent Knee Fallouts with Resistance  - 1 x daily - 7 x weekly - 2 sets - 10 reps - Kettlebell Deadlift  - 1 x daily - 7 x weekly - 3 sets - 10 reps  ASSESSMENT:  CLINICAL IMPRESSION: Pt tolerated session well today; she is planning to d/c next visit so updated HEP in preparation for d/c.    OBJECTIVE IMPAIRMENTS: decreased mobility, difficulty walking, decreased ROM, decreased strength, impaired flexibility, and pain.   ACTIVITY LIMITATIONS: bending, standing, squatting, and sleeping  PARTICIPATION LIMITATIONS: cleaning, laundry, driving, shopping, and community activity  PERSONAL FACTORS: 1 comorbidity: see pertinent history above are also affecting patient's functional outcome.   REHAB POTENTIAL: Good  CLINICAL DECISION MAKING: Stable/uncomplicated  EVALUATION COMPLEXITY: Low   GOALS: Goals reviewed with patient? Yes  SHORT TERM GOALS: (target date for Short  term goals are 3 weeks 04/28/2023)   1.  Patient will demonstrate independent use of home exercise program to maintain progress from in clinic treatments.  Goal status: on-going 04/20/23  LONG TERM GOALS: (target dates for all long term goals are 10 weeks  06/16/2023 )   1. Patient will demonstrate/report pain at worst less than or equal to 2/10 to facilitate minimal limitation in daily activity secondary to pain symptoms.  Goal status: New   2. Patient will demonstrate independent use of home exercise program to facilitate ability to maintain/progress functional gains from skilled physical therapy services.  Goal status:MET 05/21/23   3. Patient will demonstrate FOTO outcome > or = 83 % to indicate reduced disability due to condition.  Goal status: ONGOING 05/21/23   4.  Patient will demonstrate Rt LE MMT 5/5 throughout to faciltiate usual transfers, stairs, squatting at [facility] for daily life.   Goal status: MET 05/21/23   5.  Patient will demonstrate reciprocal gait pattern up and down 1 flight stairs with no rail.  Goal status: ONGOING 05/21/23   6.  Pt will be able to lift 10 # from floor to counter height with  no pain using correct body mechanics.  Goal status: MET 05/21/23      PLAN:  PT FREQUENCY: 1-2x/week  PT DURATION: 10 weeks  PLANNED INTERVENTIONS: Can include 02853- PT Re-evaluation, 97110-Therapeutic exercises, 97530- Therapeutic activity, 97112- Neuromuscular re-education, 97535- Self Care, 97140- Manual therapy, [phone]- Gait training, [phone]- Orthotic Fit/training, [phone]- Canalith repositioning, [id number]- Aquatic Therapy, 97014- Electrical stimulation (unattended), [id number]- Electrical stimulation (manual), [id number] Physical performance testing, 97016- Vasopneumatic device, [id number]- Ultrasound, [id number]- Traction (mechanical), [id number]- Ionotophoresis 4mg /ml Dexamethasone, Patient/Family education, Balance training, Stair training, Taping, Dry Needling, Joint mobilization, Joint  manipulation, Spinal manipulation, Spinal mobilization, Scar mobilization, Vestibular training, Visual/preceptual remediation/compensation, DME instructions, Cryotherapy, and Moist heat.  All performed as medically necessary.  All included unless contraindicated  PLAN FOR NEXT SESSION: hold PT, d/c or reasses based on if she returns    Corean JULIANNA Ku, PT, DPT 05/21/23 10:57 AM    PHYSICAL THERAPY DISCHARGE SUMMARY  Visits from Start of Care: 5  Current functional level related to goals / functional outcomes: See above   Remaining deficits: See above   Education / Equipment: HEP   Patient agrees to discharge. Patient goals were partially met. Patient is being discharged due to being pleased with the current functional level.   Corean JULIANNA Ku, PT, DPT 08/27/23 2:30 PM  [facility] [address] [place], [place], [address] Phone: [phone]   Fax:  [phone]

## 2023-08-24 ENCOUNTER — Other Ambulatory Visit: Payer: PPO

## 2023-10-29 ENCOUNTER — Telehealth: Payer: Self-pay | Admitting: Family Medicine

## 2023-10-29 NOTE — Telephone Encounter (Signed)
 Spoke to patient to schedule AWV She stated she moved and has new pcp
